# Patient Record
Sex: Male | Born: 1960 | Race: White | Hispanic: No | Marital: Married | Smoking: Never smoker
Health system: Southern US, Community
[De-identification: ages and names within clinical notes are randomized; demographics above are authoritative.]

## PROBLEM LIST (undated history)

## (undated) DIAGNOSIS — I209 Angina pectoris, unspecified: Secondary | ICD-10-CM

## (undated) DIAGNOSIS — I2510 Atherosclerotic heart disease of native coronary artery without angina pectoris: Secondary | ICD-10-CM

## (undated) DIAGNOSIS — J302 Other seasonal allergic rhinitis: Secondary | ICD-10-CM

## (undated) DIAGNOSIS — E559 Vitamin D deficiency, unspecified: Secondary | ICD-10-CM

## (undated) DIAGNOSIS — M1990 Unspecified osteoarthritis, unspecified site: Secondary | ICD-10-CM

## (undated) DIAGNOSIS — I10 Essential (primary) hypertension: Secondary | ICD-10-CM

## (undated) HISTORY — PX: SPINAL CORD STIMULATOR IMPLANT: SHX2422

## (undated) HISTORY — DX: Vitamin D deficiency, unspecified: E55.9

## (undated) HISTORY — PX: KNEE ARTHROSCOPY: SHX127

## (undated) HISTORY — PX: WISDOM TOOTH EXTRACTION: SHX21

---

## 2015-09-09 HISTORY — PX: CARDIAC CATHETERIZATION: SHX172

## 2015-09-10 ENCOUNTER — Encounter (HOSPITAL_COMMUNITY): Payer: Self-pay | Admitting: General Practice

## 2015-09-10 ENCOUNTER — Other Ambulatory Visit: Payer: Self-pay | Admitting: *Deleted

## 2015-09-10 ENCOUNTER — Inpatient Hospital Stay (HOSPITAL_COMMUNITY)
Admission: AD | Admit: 2015-09-10 | Discharge: 2015-09-18 | DRG: 236 | Disposition: A | Discharge status: Home / Self Care | Payer: BLUE CROSS/BLUE SHIELD | Source: Other Acute Inpatient Hospital | Attending: Cardiothoracic Surgery | Admitting: Cardiothoracic Surgery

## 2015-09-10 DIAGNOSIS — Z01810 Encounter for preprocedural cardiovascular examination: Secondary | ICD-10-CM

## 2015-09-10 DIAGNOSIS — I10 Essential (primary) hypertension: Secondary | ICD-10-CM | POA: Diagnosis present

## 2015-09-10 DIAGNOSIS — M549 Dorsalgia, unspecified: Secondary | ICD-10-CM | POA: Diagnosis present

## 2015-09-10 DIAGNOSIS — E785 Hyperlipidemia, unspecified: Secondary | ICD-10-CM | POA: Diagnosis present

## 2015-09-10 DIAGNOSIS — R079 Chest pain, unspecified: Secondary | ICD-10-CM | POA: Diagnosis present

## 2015-09-10 DIAGNOSIS — G8929 Other chronic pain: Secondary | ICD-10-CM | POA: Diagnosis present

## 2015-09-10 DIAGNOSIS — E8770 Fluid overload, unspecified: Secondary | ICD-10-CM | POA: Diagnosis present

## 2015-09-10 DIAGNOSIS — I2510 Atherosclerotic heart disease of native coronary artery without angina pectoris: Secondary | ICD-10-CM | POA: Diagnosis not present

## 2015-09-10 DIAGNOSIS — I25110 Atherosclerotic heart disease of native coronary artery with unstable angina pectoris: Principal | ICD-10-CM | POA: Diagnosis present

## 2015-09-10 DIAGNOSIS — Z951 Presence of aortocoronary bypass graft: Secondary | ICD-10-CM

## 2015-09-10 DIAGNOSIS — D62 Acute posthemorrhagic anemia: Secondary | ICD-10-CM | POA: Diagnosis not present

## 2015-09-10 DIAGNOSIS — Z6839 Body mass index (BMI) 39.0-39.9, adult: Secondary | ICD-10-CM | POA: Diagnosis not present

## 2015-09-10 DIAGNOSIS — R7303 Prediabetes: Secondary | ICD-10-CM | POA: Diagnosis present

## 2015-09-10 DIAGNOSIS — D696 Thrombocytopenia, unspecified: Secondary | ICD-10-CM | POA: Diagnosis not present

## 2015-09-10 DIAGNOSIS — M1990 Unspecified osteoarthritis, unspecified site: Secondary | ICD-10-CM | POA: Diagnosis present

## 2015-09-10 HISTORY — DX: Angina pectoris, unspecified: I20.9

## 2015-09-10 HISTORY — DX: Unspecified osteoarthritis, unspecified site: M19.90

## 2015-09-10 HISTORY — DX: Essential (primary) hypertension: I10

## 2015-09-10 HISTORY — DX: Atherosclerotic heart disease of native coronary artery without angina pectoris: I25.10

## 2015-09-10 HISTORY — DX: Other seasonal allergic rhinitis: J30.2

## 2015-09-10 LAB — BASIC METABOLIC PANEL
Anion gap: 11 (ref 5–15)
BUN: 13 mg/dL (ref 6–20)
CHLORIDE: 108 mmol/L (ref 101–111)
CO2: 24 mmol/L (ref 22–32)
CREATININE: 0.91 mg/dL (ref 0.61–1.24)
Calcium: 9.4 mg/dL (ref 8.9–10.3)
GFR calc Af Amer: >60 mL/min (ref >60)
GFR calc non Af Amer: >60 mL/min (ref >60)
Glucose, Bld: 106 mg/dL — ABNORMAL HIGH (ref 65–99)
Potassium: 3.7 mmol/L (ref 3.5–5.1)
Sodium: 143 mmol/L (ref 135–145)

## 2015-09-10 LAB — BLOOD GAS, ARTERIAL
Acid-Base Excess: 1 mmol/L (ref 0.0–2.0)
Bicarbonate: 25 mEq/L — ABNORMAL HIGH (ref 20.0–24.0)
DRAWN BY: 365291
FIO2: 0.21
O2 Saturation: 96 %
PATIENT TEMPERATURE: 98.6
PH ART: 7.413 (ref 7.350–7.450)
TCO2: 26.3 mmol/L (ref 0–100)
pCO2 arterial: 40 mmHg (ref 35.0–45.0)
pO2, Arterial: 80.3 mmHg (ref 80.0–100.0)

## 2015-09-10 LAB — TYPE AND SCREEN
ABO/RH(D): A POS
Antibody Screen: NEGATIVE

## 2015-09-10 LAB — CBC
HEMATOCRIT: 45 % (ref 39.0–52.0)
HEMOGLOBIN: 14.4 g/dL (ref 13.0–17.0)
MCH: 28.6 pg (ref 26.0–34.0)
MCHC: 32 g/dL (ref 30.0–36.0)
MCV: 89.3 fL (ref 78.0–100.0)
Platelets: 183 10*3/uL (ref 150–400)
RBC: 5.04 MIL/uL (ref 4.22–5.81)
RDW: 13.4 % (ref 11.5–15.5)
WBC: 6 10*3/uL (ref 4.0–10.5)

## 2015-09-10 LAB — HEPARIN LEVEL (UNFRACTIONATED)
Heparin Unfractionated: 0.13 IU/mL — ABNORMAL LOW (ref 0.30–0.70)
Heparin Unfractionated: 0.36 IU/mL (ref 0.30–0.70)

## 2015-09-10 LAB — PROTIME-INR
INR: 1.11 (ref 0.00–1.49)
PROTHROMBIN TIME: 14.5 s (ref 11.6–15.2)

## 2015-09-10 LAB — SURGICAL PCR SCREEN
MRSA, PCR: NEGATIVE
Staphylococcus aureus: NEGATIVE

## 2015-09-10 LAB — ABO/RH: ABO/RH(D): A POS

## 2015-09-10 LAB — APTT: aPTT: 45 seconds — ABNORMAL HIGH (ref 24–37)

## 2015-09-10 MED ORDER — ONDANSETRON HCL 4 MG/2ML IJ SOLN: 4.0000 mg | Freq: Four times a day (QID) | INTRAMUSCULAR | Status: DC | PRN

## 2015-09-10 MED ORDER — HEPARIN (PORCINE) IN NACL 100-0.45 UNIT/ML-% IJ SOLN
1800.0000 [IU]/h | INTRAMUSCULAR | Status: DC
  Administered 2015-09-11: 1600 [IU]/h via INTRAVENOUS
  Administered 2015-09-11 – 2015-09-13 (×3): 1800 [IU]/h via INTRAVENOUS
  Filled 2015-09-10 (×5): qty 250

## 2015-09-10 MED ORDER — ASPIRIN EC 81 MG PO TBEC
81.0000 mg | DELAYED_RELEASE_TABLET | Freq: Every day | ORAL | Status: DC
  Administered 2015-09-10 – 2015-09-13 (×4): 81 mg via ORAL
  Filled 2015-09-10 (×4): qty 1

## 2015-09-10 MED ORDER — ATORVASTATIN CALCIUM 40 MG PO TABS
40.0000 mg | ORAL_TABLET | Freq: Every day | ORAL | Status: DC
  Administered 2015-09-10 – 2015-09-13 (×4): 40 mg via ORAL
  Filled 2015-09-10 (×4): qty 1

## 2015-09-10 MED ORDER — MORPHINE SULFATE (PF) 2 MG/ML IV SOLN: 2.0000 mg | INTRAVENOUS | Status: DC | PRN

## 2015-09-10 MED ORDER — ACETAMINOPHEN 325 MG PO TABS: 650.0000 mg | ORAL_TABLET | Freq: Four times a day (QID) | ORAL | Status: DC | PRN

## 2015-09-10 MED ORDER — CARVEDILOL 6.25 MG PO TABS
6.2500 mg | ORAL_TABLET | Freq: Two times a day (BID) | ORAL | Status: DC
  Administered 2015-09-10 – 2015-09-11 (×2): 6.25 mg via ORAL
  Filled 2015-09-10 (×2): qty 1

## 2015-09-10 MED ORDER — LISINOPRIL 10 MG PO TABS
10.0000 mg | ORAL_TABLET | Freq: Every day | ORAL | Status: DC
  Administered 2015-09-11 – 2015-09-13 (×3): 10 mg via ORAL
  Filled 2015-09-10 (×3): qty 1

## 2015-09-10 MED ORDER — HEPARIN (PORCINE) IN NACL 100-0.45 UNIT/ML-% IJ SOLN: 1250.0000 [IU]/h | INTRAMUSCULAR | Status: DC

## 2015-09-10 MED ORDER — NITROGLYCERIN 0.4 MG SL SUBL: 0.4000 mg | SUBLINGUAL_TABLET | SUBLINGUAL | Status: DC | PRN

## 2015-09-10 NOTE — Progress Notes (Signed)
Patient arrived to the floor. IV hep infusing at 1,250 units/hour. Tele monitor placed. CCMD notified. No Chest pain. No SOB. Patient oriented to the unit. Call bell within reach. Will continue to monitor.   Valinda HoarLexie Natasa Stigall RN

## 2015-09-10 NOTE — H&P (Signed)
[address]       [place] [id number]             [phone]        Billy Waters Date of Birth: 04-May-1961  [facility] [place] Primary Care: Dr. Darcey NoraPositono  Chief Complaint:   CAD  History of Present Illness:      Billy Waters is a 55 yo white male with known history of HTN, Hyperlipidemia, and chronic back pain.  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  He noticed recently that after leaving softball practice while walking up the hill to his car he developed chest tightness.  He states this continued any time he was walking up an incline.  His wife works for a Games [employer]ctors office and got him an appointment to be seen.  EKG was obtained and showed changes that were concerning to his PCP.  He referred the patient to follow up with his Primary cardiology who after review of the EKG felt the patient should undergo stress testing.  This was originally scheduled for today, but the patient's chest tightness worsened.  He again presented for evaluation with his Cardiologist who performed his stress test sooner.  He lasted about 4 min prior to developing chest tightness.  He subsequently was scheduled for cardiac catheterization which showed multivessel CAD and a preserved EF.  It was felt coronary bypass grafting would be indicated and [facility]  He denies family history of heart disease.  He denies nicotine abuse.  Currently the patient is chest pain free.  Current Activity/ Functional Status: Patient is independent with mobility/ambulation, transfers, ADL's, IADL's.   Zubrod Score: At the time of surgery this patient's most appropriate activity status/level should be described as: []     0    Normal activity, no symptoms [x]     1    Restricted in physical strenuous activity but ambulatory, able to do out light work []     2     Ambulatory and capable of self care, unable to do work activities, up and about                 more than 50%  Of the time                            []     3    Only limited self care, in bed greater than 50% of waking hours []     4    Completely disabled, no self care, confined to bed or chair []     5    Moribund  No past medical history on file.  No past surgical history on file.  History  Smoking status  . Not on file  Smokeless tobacco  . Not on file    History  Alcohol Use: Not on file    Social History   Social History  . Marital Status: Married    Spouse Name: N/A  . Number of Children: N/A  . Years of Education: N/A   Occupational History  . Not on file.   Social History Main Topics  . Smoking status: Not on file  . Smokeless tobacco: Not on file  . Alcohol Use: Not on file  . Drug Use: Not on file  .  Sexual Activity: Not on file   Other Topics Concern  . Not on file   Social History Narrative  . No narrative on file    Allergies not on file  Current Facility-Administered Medications  Medication Dose Route Frequency Provider Last Rate Last Dose  . acetaminophen (TYLENOL) tablet 650 mg  650 mg Oral Q6H PRN Erin R Barrett, PA-C      . aspirin EC tablet 81 mg  81 mg Oral Daily Erin R Barrett, PA-C      . atorvastatin (LIPITOR) tablet 40 mg  40 mg Oral q1800 Erin R Barrett, PA-C      . carvedilol (COREG) tablet 6.25 mg  6.25 mg Oral BID WC Erin R Barrett, PA-C      . [START ON 09/11/2015] lisinopril (PRINIVIL,ZESTRIL) tablet 10 mg  10 mg Oral Daily Erin R Barrett, PA-C      . morphine 2 MG/ML injection 2 mg  2 mg Intravenous Q1H PRN Erin R Barrett, PA-C      . nitroGLYCERIN (NITROSTAT) SL tablet 0.4 mg  0.4 mg Sublingual Q5 min PRN Erin R Barrett, PA-C      . ondansetron (ZOFRAN) injection 4 mg  4 mg Intravenous Q6H PRN Erin R Barrett, PA-C        No prescriptions prior to admission    No family history on file.   Review of Systems:  Pertinent items  are noted in HPI.     Cardiac Review of Systems: Y or N  Chest Pain [  y  ]  Resting SOB [ n  ] Exertional SOB  [n  ]  Orthopnea [  ]   Pedal Edema [   ]    Palpitations [n  ] Syncope  [  ]   Presyncope [   ]  General Review of Systems: [Y] = yes [  ]=no Constitional: recent weight change [  ]; anorexia [  ]; fatigue [  ]; nausea [ n ]; night sweats [n  ]; fever [  ]; or chills [  ]                                                               Dental: poor dentition[  ]; Last Dentist visit:   Eye : blurred vision [  ]; diplopia [   ]; vision changes [  ];  Amaurosis fugax[  ]; Resp: cough [  ];  wheezing[  ];  hemoptysis[  ]; shortness of breath[  ]; paroxysmal nocturnal dyspnea[n  ]; dyspnea on exertion[n  ]; or orthopnea[  ];  GI:  gallstones[  ], vomiting[  ];  dysphagia[  ]; melena[  ];  hematochezia [  ]; heartburn[  ];   Hx of  Colonoscopy[  ]; GU: kidney stones [  ]; hematuria[  ];   dysuria [  ];  nocturia[  ];  history of     obstruction [  ]; urinary frequency [  ]             Skin: rash, swelling[  ];, hair loss[  ];  peripheral edema[  ];  or itching[  ]; Musculosketetal: myalgias[  ];  joint swelling[  ];  joint erythema[  ];  joint pain[  ];  back  pain[y  ];  Heme/Lymph: bruising[  ];  bleeding[  ];  anemia[  ];  Neuro: TIA[ n ];  headaches[  ];  stroke[  ];  vertigo[  ];  seizures[  ];   paresthesias[  ];  difficulty walking[  ];  Psych:depression[n  ]; anxiety[ n ];  Endocrine: diabetes[n  ];  thyroid dysfunction[n  ];  Immunizations: Flu [  ]; Pneumococcal[  ];  Other:  Physical Exam: BP 155/74 mmHg  Pulse 60  Temp(Src) 98 F (36.7 C) (Oral)  Resp 18  SpO2 94%   General appearance: alert, cooperative and no distress Head: Normocephalic, without obvious abnormality, atraumatic Neck: no adenopathy, no carotid bruit, no JVD, supple, symmetrical, trachea midline and thyroid not enlarged, symmetric, no tenderness/mass/nodules Lymph nodes: Cervical, supraclavicular, and  axillary nodes normal. Resp: clear to auscultation bilaterally Cardio: regular rate and rhythm GI: soft, non-tender; bowel sounds normal; no masses,  no organomegaly Extremities: extremities normal, atraumatic, no cyanosis or edema Neurologic: Grossly normal  Diagnostic Studies & Laboratory data:     Assessment / Plan:      1. CAD- on Heparin drip, tentative for CABG this admission 2. HTN- on Coreg, Lisinopril 3. Hyperlipidemia- on Lipitor 4. Preoperative testing- CBC, BMET, UA, CXR, ABG, Carotid Doppler, Type and Screen ordered 5. Dispo- patient stable, chest pain free on Heparin, plan for CABG Monday   I  spent 40 minutes counseling the patient face to face and 50% or more the  time was spent in counseling and coordination of care. The total time spent in the appointment was 20 minutes.    Denny Peon Barrett PA-C  09/10/2015 3:20 PM  Agree  With above findings and plan Cardiac cath personally reviewed and counseled with patient His best long term therapy is CABG which will be done first available time in OR schedule patient examined and medical record reviewed,agree with above note. Kathlee Nations Trigt III 09/10/2015

## 2015-09-10 NOTE — Progress Notes (Addendum)
ANTICOAGULATION CONSULT NOTE - Initial Consult  Pharmacy Consult for heparin  Indication: chest pain/ACS  Allergies not on file  Patient Measurements: Wt= 124.9kg Ht: 5' 10'' Heparin Dosing Weight: 101.3kg  Vital Signs: Temp: 98 F (36.7 C) (04/27 1415) Temp Source: Oral (04/27 1415) BP: 155/74 mmHg (04/27 1415) Pulse Rate: 60 (04/27 1415)  Labs: No results for input(s): HGB, HCT, PLT, APTT, LABPROT, INR, HEPARINUNFRC, HEPRLOWMOCWT, CREATININE, CKTOTAL, CKMB, TROPONINI in the last 72 hours.  CrCl cannot be calculated (Unknown ideal weight.).   Medical History: No past medical history on file.  Medications:  Scheduled:  . aspirin EC  81 mg Oral Daily  . atorvastatin  40 mg Oral q1800  . carvedilol  6.25 mg Oral BID WC  . [START ON 09/11/2015] lisinopril  10 mg Oral Daily    Assessment: 55 yo [place] with plans for [place] (scheduled for 4/30 per patient). [facility] Heparin is currently at 1250 units/hr -Labs [place]: hg= 14.8, plt= 184  Goal of Therapy:  Heparin level 0.3-0.7 units/ml Monitor platelets by anticoagulation protocol: Yes   Plan:  -No heparin changes now -Heparin level now and daily wth CBC daily  Harland Germanndrew Meyer, Pharm D 09/10/2015 2:58 PM   Addendum: Initial heparin level is low at 0.13 on 1250 units/hr. No issues with infusion.  Plan: 1) Increase heparin to 1600 units/hr 2) Check 6 hour heparin level  Louie CasaJennifer Caliya Narine, PharmD, BCPS 09/10/2015, 5:01 PM

## 2015-09-11 ENCOUNTER — Inpatient Hospital Stay (HOSPITAL_COMMUNITY): Payer: BLUE CROSS/BLUE SHIELD

## 2015-09-11 DIAGNOSIS — I2510 Atherosclerotic heart disease of native coronary artery without angina pectoris: Secondary | ICD-10-CM

## 2015-09-11 LAB — PULMONARY FUNCTION TEST
DL/VA % pred: 107 %
DL/VA: 4.96 ml/min/mmHg/L
DLCO cor % pred: 89 %
DLCO cor: 28.85 ml/min/mmHg
DLCO unc % pred: 89 %
DLCO unc: 29.01 ml/min/mmHg
FEF 25-75 Pre: 4.8 L/sec
FEF2575-%Pred-Pre: 148 %
FEV1-%Pred-Pre: 89 %
FEV1-Pre: 3.37 L
FEV1FVC-%Pred-Pre: 113 %
FEV6-%Pred-Pre: 81 %
FEV6-Pre: 3.86 L
FEV6FVC-%Pred-Pre: 104 %
FVC-%Pred-Pre: 78 %
FVC-Pre: 3.86 L
Pre FEV1/FVC ratio: 87 %
Pre FEV6/FVC Ratio: 100 %
RV % pred: 88 %
RV: 1.88 L
TLC % pred: 85 %
TLC: 5.97 L

## 2015-09-11 LAB — CBC
HCT: 44.9 % (ref 39.0–52.0)
Hemoglobin: 14.8 g/dL (ref 13.0–17.0)
MCH: 29.1 pg (ref 26.0–34.0)
MCHC: 33 g/dL (ref 30.0–36.0)
MCV: 88.2 fL (ref 78.0–100.0)
Platelets: 179 10*3/uL (ref 150–400)
RBC: 5.09 MIL/uL (ref 4.22–5.81)
RDW: 13.4 % (ref 11.5–15.5)
WBC: 7.3 10*3/uL (ref 4.0–10.5)

## 2015-09-11 LAB — URINALYSIS, ROUTINE W REFLEX MICROSCOPIC
Bilirubin Urine: NEGATIVE
Glucose, UA: NEGATIVE mg/dL
Hgb urine dipstick: NEGATIVE
Ketones, ur: NEGATIVE mg/dL
Leukocytes, UA: NEGATIVE
Nitrite: NEGATIVE
Protein, ur: NEGATIVE mg/dL
Specific Gravity, Urine: 1.016 (ref 1.005–1.030)
pH: 5.5 (ref 5.0–8.0)

## 2015-09-11 LAB — HEPARIN LEVEL (UNFRACTIONATED)
Heparin Unfractionated: 0.24 IU/mL — ABNORMAL LOW (ref 0.30–0.70)
Heparin Unfractionated: 0.46 IU/mL (ref 0.30–0.70)

## 2015-09-11 LAB — ECHOCARDIOGRAM COMPLETE
Height: 70 in
Weight: 4405.67 oz

## 2015-09-11 MED ORDER — HYDROCHLOROTHIAZIDE 12.5 MG PO CAPS
12.5000 mg | ORAL_CAPSULE | Freq: Every day | ORAL | Status: DC
  Administered 2015-09-11 – 2015-09-13 (×3): 12.5 mg via ORAL
  Filled 2015-09-11 (×3): qty 1

## 2015-09-11 MED ORDER — MAGNESIUM HYDROXIDE 400 MG/5ML PO SUSP: 30.0000 mL | Freq: Every day | ORAL | Status: DC | PRN

## 2015-09-11 MED ORDER — ZOLPIDEM TARTRATE 5 MG PO TABS: 5.0000 mg | ORAL_TABLET | Freq: Every evening | ORAL | Status: DC | PRN

## 2015-09-11 MED ORDER — ALBUTEROL SULFATE (2.5 MG/3ML) 0.083% IN NEBU
2.5000 mg | INHALATION_SOLUTION | Freq: Once | RESPIRATORY_TRACT | Status: AC
  Administered 2015-09-11: 2.5 mg via RESPIRATORY_TRACT

## 2015-09-11 MED ORDER — CARVEDILOL 3.125 MG PO TABS
3.1250 mg | ORAL_TABLET | Freq: Two times a day (BID) | ORAL | Status: DC
  Administered 2015-09-11 – 2015-09-13 (×5): 3.125 mg via ORAL
  Filled 2015-09-11 (×5): qty 1

## 2015-09-11 MED ORDER — ALUM & MAG HYDROXIDE-SIMETH 200-200-20 MG/5ML PO SUSP: 30.0000 mL | ORAL | Status: DC | PRN

## 2015-09-11 MED ORDER — GUAIFENESIN-DM 100-10 MG/5ML PO SYRP: 15.0000 mL | ORAL_SOLUTION | ORAL | Status: DC | PRN

## 2015-09-11 MED FILL — Heparin Sodium (Porcine) 100 Unt/ML in Sodium Chloride 0.45%: INTRAMUSCULAR | Qty: 250 | Status: AC

## 2015-09-11 NOTE — Progress Notes (Signed)
Chaplain presented to the patient's room to follow up a request to provide the patient with information and assist as needed with an Advance Directive. The patient was in medica testing at the time of this visit. The family was in the room and reports the patient has the Advance Directive and will call when he has completed it for Notary signature.chaplain will follow up with the patient Chaplain Janell Quietudrey Zenon Leaf [phone]

## 2015-09-11 NOTE — Progress Notes (Signed)
ANTICOAGULATION CONSULT NOTE - Follow Up Consult  Pharmacy Consult for Heparin  Indication: Multi-vessel disease awaiting CABG  Not on File  Patient Measurements: Height: 5\' 10"  (177.8 cm) Weight: 275 lb 5.7 oz (124.9 kg) IBW/kg (Calculated) : 73  Vital Signs: Temp: 98 F (36.7 C) (04/27 1415) Temp Source: Oral (04/27 1415) BP: 165/75 mmHg (04/27 1719) Pulse Rate: 75 (04/27 1719)  Labs:  Recent Labs  09/10/15 1529 09/10/15 2329  HGB 14.4  --   HCT 45.0  --   PLT 183  --   APTT 45*  --   LABPROT 14.5  --   INR 1.11  --   HEPARINUNFRC 0.13* 0.36  CREATININE 0.91  --     Estimated Creatinine Clearance: 123.1 mL/min (by C-G formula based on Cr of 0.91).   Assessment: [facility], heparin level is therapeutic x 1 after rate increase  Goal of Therapy:  Heparin level 0.3-0.7 units/ml Monitor platelets by anticoagulation protocol: Yes   Plan:  -Continue heparin at 1600 units/hr -Confirmatory HL with AM labs  Abran DukeLedford, Jakorian Marengo 09/11/2015,12:41 AM

## 2015-09-11 NOTE — Progress Notes (Signed)
ANTICOAGULATION CONSULT NOTE  Pharmacy Consult for heparin  Indication: chest pain/ACS  No Known Allergies  Patient Measurements: Wt= 124.9kg Ht: 5' 10'' Heparin Dosing Weight: 101.3kg  Vital Signs: Temp: 98.3 F (36.8 C) (04/28 1030) Temp Source: Oral (04/28 1030) BP: 127/58 mmHg (04/28 1030) Pulse Rate: 54 (04/28 0639)  Labs:  Recent Labs  09/10/15 1529 09/10/15 2329 09/11/15 0225 09/11/15 1421  HGB 14.4  --  14.8  --   HCT 45.0  --  44.9  --   PLT 183  --  179  --   APTT 45*  --   --   --   LABPROT 14.5  --   --   --   INR 1.11  --   --   --   HEPARINUNFRC 0.13* 0.36 0.24* 0.46  CREATININE 0.91  --   --   --     Estimated Creatinine Clearance: 123.1 mL/min (by C-G formula based on Cr of 0.91).   Medications:  Scheduled:  . aspirin EC  81 mg Oral Daily  . atorvastatin  40 mg Oral q1800  . carvedilol  3.125 mg Oral BID WC  . hydrochlorothiazide  12.5 mg Oral Daily  . lisinopril  10 mg Oral Daily    Assessment: 55 [place] with plans for [place] (scheduled for 4/30 per patient). [facility]. Heparin is currently at 1600 units/hr -Heparin level is at goal (HL= 0.46)  Goal of Therapy:  Heparin level 0.3-0.7 units/ml Monitor platelets by anticoagulation protocol: Yes   Plan:  -Increase heparin to 1800 units/hr -Heparin level daily wth CBC daily  Harland Germanndrew Loella Hickle, Pharm D 09/11/2015 3:51 PM

## 2015-09-11 NOTE — Progress Notes (Signed)
  Echocardiogram 2D Echocardiogram has been performed.  Billy SavoyCasey N Camilo Waters 09/11/2015, 12:37 PM

## 2015-09-11 NOTE — Progress Notes (Addendum)
      [address]       [place] [id number]             [phone]       Procedure(s) (LRB): CORONARY ARTERY BYPASS GRAFTING (CABG) (N/A) TRANSESOPHAGEAL ECHOCARDIOGRAM (TEE) (N/A)  Subjective:  Mr. Billy Waters has no complaints.  Denies chest pain.   Objective: Vital signs in last 24 hours: Temp:  [97.7 F (36.5 C)-98 F (36.7 C)] 97.7 F (36.5 C) (04/28 0516) Pulse Rate:  [54-75] 54 (04/28 0639) Cardiac Rhythm:  [-] Normal sinus rhythm (04/27 2045) Resp:  [18] 18 (04/28 0516) BP: (121-165)/(60-75) 123/60 mmHg (04/28 0639) SpO2:  [94 %-96 %] 96 % (04/28 0516) Weight:  [275 lb 5.7 oz (124.9 kg)] 275 lb 5.7 oz (124.9 kg) (04/27 1500)  General appearance: alert, cooperative and no distress Heart: regular rate and rhythm Lungs: clear to auscultation bilaterally Abdomen: soft, non-tender; bowel sounds normal; no masses,  no organomegaly Extremities: extremities normal, atraumatic, no cyanosis or edema Wound: clean and dry  Lab Results:  Recent Labs  09/10/15 1529 09/11/15 0225  WBC 6.0 7.3  HGB 14.4 14.8  HCT 45.0 44.9  PLT 183 179   BMET:  Recent Labs  09/10/15 1529  NA 143  K 3.7  CL 108  CO2 24  GLUCOSE 106*  BUN 13  CREATININE 0.91  CALCIUM 9.4    PT/INR:  Recent Labs  09/10/15 1529  LABPROT 14.5  INR 1.11   ABG    Component Value Date/Time   PHART 7.413 09/10/2015 1640   HCO3 25.0* 09/10/2015 1640   TCO2 26.3 09/10/2015 1640   O2SAT 96.0 09/10/2015 1640   CBG (last 3)  No results for input(s): GLUCAP in the last 72 hours.  Assessment/Plan: S/P Procedure(s) (LRB): CORONARY ARTERY BYPASS GRAFTING (CABG) (N/A) TRANSESOPHAGEAL ECHOCARDIOGRAM (TEE) (N/A)  1. CV- CAD, Sinus Huston FoleyBrady, Hypertensive at times- will decrease Coreg to 3.125, continue Heparin, Lisionpril, and statin 2. Pulm- no acute issues, CXR looks good 3. Dispo- patient stable, continue Heparin, preoperative test being completed, plan for OR Monday   LOS: 1  day    BARRETT, ERIN 09/11/2015  Echo, CXR and Dopplers all are normal  plan mutivessel cabg with L raqdial artery Mon am patient examined and medical record reviewed,agree with above note. Kathlee Nationseter Van Trigt III 09/11/2015

## 2015-09-11 NOTE — Progress Notes (Signed)
VASCULAR LAB PRELIMINARY  PRELIMINARY  PRELIMINARY  PRELIMINARY  Pre-op Cardiac Surgery  Carotid Findings:  Bilateral:  1-39% ICA stenosis.  Vertebral artery flow is antegrade.     Upper Extremity Right Left  Brachial Pressures 146 Triphasic 146 Triphasic  Radial Waveforms Triphasic Triphasic  Ulnar Waveforms Triphasic Triphasic  Palmar Arch (Allen's Test) Normal Normal   Findings:  Doppler waveforms remained normal with both radial and ulnar compressions    Lower  Extremity Right Left  Dorsalis Pedis 205 Triphasic 205 Triphasic  Posterior Tibial 184 Triphasic 184 Triphasic  Ankle/Brachial Indices 1.4 1.4     Findings:  ABIs and Doppler waveforms are within normal limits bilaterally at rest.   Dulse Rutan, RVS 09/11/2015, 12:28 PM

## 2015-09-11 NOTE — Progress Notes (Signed)
ANTICOAGULATION CONSULT NOTE  Pharmacy Consult for heparin  Indication: chest pain/ACS  Not on File  Patient Measurements: Wt= 124.9kg Ht: 5' 10'' Heparin Dosing Weight: 101.3kg  Vital Signs: Temp: 97.7 F (36.5 C) (04/28 0516) Temp Source: Oral (04/28 0516) BP: 123/60 mmHg (04/28 0639) Pulse Rate: 54 (04/28 0639)  Labs:  Recent Labs  09/10/15 1529 09/10/15 2329 09/11/15 0225  HGB 14.4  --  14.8  HCT 45.0  --  44.9  PLT 183  --  179  APTT 45*  --   --   LABPROT 14.5  --   --   INR 1.11  --   --   HEPARINUNFRC 0.13* 0.36 0.24*  CREATININE 0.91  --   --     Estimated Creatinine Clearance: 123.1 mL/min (by C-G formula based on Cr of 0.91).   Medications:  Scheduled:  . aspirin EC  81 mg Oral Daily  . atorvastatin  40 mg Oral q1800  . carvedilol  3.125 mg Oral BID WC  . lisinopril  10 mg Oral Daily    Assessment: 55 [place] plans for [place] (scheduled for 4/30 per patient). [facility] Heparin is currently at 1600 units/hr -Heparin level is below goal (HL= 0.24)  Goal of Therapy:  Heparin level 0.3-0.7 units/ml Monitor platelets by anticoagulation protocol: Yes   Plan:  -Increase heparin to 1800 units/hr -Heparin level in 6hrs and daily wth CBC daily  Harland Germanndrew Shamonique Battiste, Pharm D 09/11/2015 8:05 AM   Addendum: Initial heparin level is low at 0.13 on 1250 units/hr. No issues with infusion.  Plan: 1) Increase heparin to 1600 units/hr 2) Check 6 hour heparin level  Louie CasaJennifer Markle, PharmD, BCPS 09/10/2015, 5:01 PM

## 2015-09-11 NOTE — Care Management Note (Signed)
Case Management Note Donn PieriniKristi Jacobus Colvin RN, BSN Unit 2W-Case Manager [phone]  Patient Details  Name: Billy Waters MRN: [id number] Date of Birth: February 02, 1961  Subjective/Objective:    Pt admitted with 3VD post cath - plan for CABG on 09/14/15                Action/Plan: PTA pt lived at home- anticipate return home- CM to follow post op for pt progress and d/c needs  Expected Discharge Date:                  Expected Discharge Plan:  Home/Self Care  In-House Referral:     Discharge planning Services  CM Consult  Post Acute Care Choice:    Choice offered to:     DME Arranged:    DME Agency:     HH Arranged:    HH Agency:     Status of Service:  In process, will continue to follow  Medicare Important Message Given:    Date Medicare IM Given:    Medicare IM give by:    Date Additional Medicare IM Given:    Additional Medicare Important Message give by:     If discussed at Long Length of Stay Meetings, dates discussed:    Additional Comments:  Darrold SpanWebster, Verdene Creson Hall, RN 09/11/2015, 2:45 PM

## 2015-09-11 NOTE — Progress Notes (Signed)
CARDIAC REHAB PHASE I   Pt is ambulating independently with family, no complaints, declines additional ambulation at this time. Pre-op education completed with pt and sisters at bedside. Reviewed IS, activity progression, sternal precautions, cardiac surgery booklet and cardiac surgery guidelines. Left instructions to view cardiac surgery videos. Pt verbalized understanding. Pt in bed, call bell within reach. Will follow.   [id number]  Joylene GrapesEmily C Stedman [place], RN, BSN 09/11/2015 2:51 PM

## 2015-09-11 NOTE — Progress Notes (Signed)
Utilization review completed.  

## 2015-09-12 LAB — CBC
HCT: 46.4 % (ref 39.0–52.0)
Hemoglobin: 15.3 g/dL (ref 13.0–17.0)
MCH: 29.8 pg (ref 26.0–34.0)
MCHC: 33 g/dL (ref 30.0–36.0)
MCV: 90.4 fL (ref 78.0–100.0)
Platelets: 187 10*3/uL (ref 150–400)
RBC: 5.13 MIL/uL (ref 4.22–5.81)
RDW: 13.4 % (ref 11.5–15.5)
WBC: 8.7 10*3/uL (ref 4.0–10.5)

## 2015-09-12 LAB — HEPARIN LEVEL (UNFRACTIONATED): Heparin Unfractionated: 0.56 IU/mL (ref 0.30–0.70)

## 2015-09-12 NOTE — Progress Notes (Signed)
ANTICOAGULATION CONSULT NOTE  Pharmacy Consult for heparin  Indication: chest pain/ACS  No Known Allergies  Patient Measurements: Wt= 124.9kg Ht: 5' 10'' Heparin Dosing Weight: 101.3kg  Vital Signs: Temp: 98 F (36.7 C) (04/29 0605) Temp Source: Oral (04/29 0605) BP: 128/60 mmHg (04/29 0605) Pulse Rate: 54 (04/29 0605)  Labs:  Recent Labs  09/10/15 1529  09/11/15 0225 09/11/15 1421 09/12/15 0256  HGB 14.4  --  14.8  --  15.3  HCT 45.0  --  44.9  --  46.4  PLT 183  --  179  --  187  APTT 45*  --   --   --   --   LABPROT 14.5  --   --   --   --   INR 1.11  --   --   --   --   HEPARINUNFRC 0.13*  < > 0.24* 0.46 0.56  CREATININE 0.91  --   --   --   --   < > = values in this interval not displayed.  Estimated Creatinine Clearance: 123.1 mL/min (by C-G formula based on Cr of 0.91).   Medications:  Scheduled:  . aspirin EC  81 mg Oral Daily  . atorvastatin  40 mg Oral q1800  . carvedilol  3.125 mg Oral BID WC  . hydrochlorothiazide  12.5 mg Oral Daily  . lisinopril  10 mg Oral Daily    Assessment: 55 [place] with plans for CABG (scheduled for 5/1). [facility]. Heparin is currently at 1800 units/hr -Heparin level is at goal (HL= 0.56)  Goal of Therapy:  Heparin level 0.3-0.7 units/ml Monitor platelets by anticoagulation protocol: Yes   Plan:  -No heparin changes needed -Heparin level daily wth CBC daily  Harland Germanndrew Paxten Appelt, Pharm D 09/12/2015 10:29 AM

## 2015-09-12 NOTE — Progress Notes (Signed)
Patient lying in bed, family present at bedside. Call light within reach. 

## 2015-09-12 NOTE — Progress Notes (Addendum)
      [address]       [place] [id number]             [phone]        Procedure(s) (LRB): CORONARY ARTERY BYPASS GRAFTING (CABG) (N/A) TRANSESOPHAGEAL ECHOCARDIOGRAM (TEE) (N/A) LEFT RADIAL ARTERY HARVEST (Left) Subjective: Feels fine  Objective: Vital signs in last 24 hours: Temp:  [98 F (36.7 C)-98.3 F (36.8 C)] 98 F (36.7 C) (04/29 0605) Pulse Rate:  [54-62] 54 (04/29 0605) Cardiac Rhythm:  [-] Normal sinus rhythm (04/28 1900) Resp:  [18] 18 (04/29 0605) BP: (127-131)/(55-67) 128/60 mmHg (04/29 0605) SpO2:  [92 %-100 %] 100 % (04/29 0605)  Hemodynamic parameters for last 24 hours:    Intake/Output from previous day: 04/28 0701 - 04/29 0700 In: 240 [P.O.:240] Out: -  Intake/Output this shift:    General appearance: alert, cooperative and no distress Heart: regular rate and rhythm Lungs: clear to auscultation bilaterally Abdomen: benign Extremities: no edema Wound: incis healing well  Lab Results:  Recent Labs  09/11/15 0225 09/12/15 0256  WBC 7.3 8.7  HGB 14.8 15.3  HCT 44.9 46.4  PLT 179 187   BMET:  Recent Labs  09/10/15 1529  NA 143  K 3.7  CL 108  CO2 24  GLUCOSE 106*  BUN 13  CREATININE 0.91  CALCIUM 9.4    PT/INR:  Recent Labs  09/10/15 1529  LABPROT 14.5  INR 1.11   ABG    Component Value Date/Time   PHART 7.413 09/10/2015 1640   HCO3 25.0* 09/10/2015 1640   TCO2 26.3 09/10/2015 1640   O2SAT 96.0 09/10/2015 1640   CBG (last 3)  No results for input(s): GLUCAP in the last 72 hours.  Meds Scheduled Meds: . aspirin EC  81 mg Oral Daily  . atorvastatin  40 mg Oral q1800  . carvedilol  3.125 mg Oral BID WC  . hydrochlorothiazide  12.5 mg Oral Daily  . lisinopril  10 mg Oral Daily   Continuous Infusions: . heparin 1,800 Units/hr (09/11/15 1746)   PRN Meds:.acetaminophen, alum & mag hydroxide-simeth, guaiFENesin-dextromethorphan, magnesium hydroxide, morphine injection, nitroGLYCERIN,  ondansetron (ZOFRAN) IV, zolpidem  Xrays Dg Chest 2 View  09/11/2015  CLINICAL DATA:  55 year old male preoperative study for CABG. Initial encounter. EXAM: CHEST  2 VIEW COMPARISON:  None. FINDINGS: Lung volumes are within normal limits. Normal cardiac size and mediastinal contours. Visualized tracheal air column is within normal limits. No pneumothorax, pulmonary edema, pleural effusion or confluent pulmonary opacity. Flowing degenerative osteophytes in the mid and lower thoracic spine. No acute osseous abnormality identified. IMPRESSION: No acute cardiopulmonary abnormality. Electronically Signed   By: Odessa FlemingH  Hall M.D.   On: 09/11/2015 07:53    Assessment/Plan: S/P Procedure(s) (LRB): CORONARY ARTERY BYPASS GRAFTING (CABG) (N/A) TRANSESOPHAGEAL ECHOCARDIOGRAM (TEE) (N/A) LEFT RADIAL ARTERY HARVEST (Left)  1 stable without CP/SOB 2 he is brady in 50's but tolerating well, BP is stable- monitor ,if gets too slow will have to d/c coreg  LOS: 2 days    GOLD,WAYNE E 09/12/2015  Patient seen and examined, agree with above For CABG Monday  Quindon Denker C. Dorris FetchHendrickson, MD [facility] [phone]

## 2015-09-13 LAB — CBC
HCT: 46.1 % (ref 39.0–52.0)
Hemoglobin: 15.3 g/dL (ref 13.0–17.0)
MCH: 29.8 pg (ref 26.0–34.0)
MCHC: 33.2 g/dL (ref 30.0–36.0)
MCV: 89.7 fL (ref 78.0–100.0)
Platelets: 195 10*3/uL (ref 150–400)
RBC: 5.14 MIL/uL (ref 4.22–5.81)
RDW: 13.5 % (ref 11.5–15.5)
WBC: 9.1 10*3/uL (ref 4.0–10.5)

## 2015-09-13 LAB — HEPARIN LEVEL (UNFRACTIONATED): Heparin Unfractionated: 0.43 IU/mL (ref 0.30–0.70)

## 2015-09-13 MED ORDER — METOPROLOL TARTRATE 12.5 MG HALF TABLET
12.5000 mg | ORAL_TABLET | Freq: Once | ORAL | Status: AC
  Administered 2015-09-14: 12.5 mg via ORAL
  Filled 2015-09-13: qty 1

## 2015-09-13 MED ORDER — SODIUM CHLORIDE 0.9 % IV SOLN
1500.0000 mg | INTRAVENOUS | Status: AC
  Administered 2015-09-14: 1500 mg via INTRAVENOUS
  Filled 2015-09-13: qty 1500

## 2015-09-13 MED ORDER — PLASMA-LYTE 148 IV SOLN
INTRAVENOUS | Status: AC
  Administered 2015-09-14: 500 mL
  Filled 2015-09-13: qty 2.5

## 2015-09-13 MED ORDER — CHLORHEXIDINE GLUCONATE 0.12 % MT SOLN
15.0000 mL | Freq: Once | OROMUCOSAL | Status: AC
  Administered 2015-09-14: 15 mL via OROMUCOSAL
  Filled 2015-09-13: qty 15

## 2015-09-13 MED ORDER — EPINEPHRINE HCL 1 MG/ML IJ SOLN
0.0000 ug/min | INTRAVENOUS | Status: DC
  Filled 2015-09-13: qty 4

## 2015-09-13 MED ORDER — SODIUM CHLORIDE 0.9 % IV SOLN
INTRAVENOUS | Status: AC
  Administered 2015-09-14: 69.8 mL/h via INTRAVENOUS
  Filled 2015-09-13: qty 40

## 2015-09-13 MED ORDER — PHENYLEPHRINE HCL 10 MG/ML IJ SOLN
30.0000 ug/min | INTRAVENOUS | Status: AC
  Administered 2015-09-14: 15 ug/min via INTRAVENOUS
  Filled 2015-09-13: qty 2

## 2015-09-13 MED ORDER — ALPRAZOLAM 0.25 MG PO TABS: 0.2500 mg | ORAL_TABLET | ORAL | Status: DC | PRN

## 2015-09-13 MED ORDER — BISACODYL 5 MG PO TBEC
5.0000 mg | DELAYED_RELEASE_TABLET | Freq: Once | ORAL | Status: AC
  Administered 2015-09-13: 5 mg via ORAL
  Filled 2015-09-13: qty 1

## 2015-09-13 MED ORDER — NITROGLYCERIN IN D5W 200-5 MCG/ML-% IV SOLN
2.0000 ug/min | INTRAVENOUS | Status: AC
  Administered 2015-09-14: 5 ug/min via INTRAVENOUS
  Filled 2015-09-13: qty 250

## 2015-09-13 MED ORDER — DEXMEDETOMIDINE HCL IN NACL 400 MCG/100ML IV SOLN
0.1000 ug/kg/h | INTRAVENOUS | Status: AC
  Administered 2015-09-14: .2 ug/kg/h via INTRAVENOUS
  Filled 2015-09-13 (×2): qty 100

## 2015-09-13 MED ORDER — TEMAZEPAM 15 MG PO CAPS
15.0000 mg | ORAL_CAPSULE | Freq: Once | ORAL | Status: AC | PRN
  Administered 2015-09-13: 15 mg via ORAL
  Filled 2015-09-13: qty 1

## 2015-09-13 MED ORDER — CHLORHEXIDINE GLUCONATE 4 % EX LIQD
60.0000 mL | Freq: Once | CUTANEOUS | Status: AC
  Administered 2015-09-14: 4 via TOPICAL
  Filled 2015-09-13: qty 60

## 2015-09-13 MED ORDER — DEXTROSE 5 % IV SOLN
750.0000 mg | INTRAVENOUS | Status: DC
  Filled 2015-09-13: qty 750

## 2015-09-13 MED ORDER — DEXTROSE 5 % IV SOLN
1.5000 g | INTRAVENOUS | Status: AC
  Administered 2015-09-14: .75 g via INTRAVENOUS
  Administered 2015-09-14: 1.5 g via INTRAVENOUS
  Filled 2015-09-13: qty 1.5

## 2015-09-13 MED ORDER — MAGNESIUM SULFATE 50 % IJ SOLN
40.0000 meq | INTRAMUSCULAR | Status: DC
  Filled 2015-09-13: qty 10

## 2015-09-13 MED ORDER — POTASSIUM CHLORIDE 2 MEQ/ML IV SOLN
80.0000 meq | INTRAVENOUS | Status: DC
  Filled 2015-09-13: qty 40

## 2015-09-13 MED ORDER — SODIUM CHLORIDE 0.9 % IV SOLN
INTRAVENOUS | Status: AC
  Administered 2015-09-14: 1 [IU]/h via INTRAVENOUS
  Filled 2015-09-13: qty 2.5

## 2015-09-13 MED ORDER — SODIUM CHLORIDE 0.9 % IV SOLN
INTRAVENOUS | Status: DC
  Filled 2015-09-13: qty 30

## 2015-09-13 MED ORDER — DIAZEPAM 5 MG PO TABS
5.0000 mg | ORAL_TABLET | Freq: Once | ORAL | Status: AC
  Administered 2015-09-14: 5 mg via ORAL
  Filled 2015-09-13: qty 1

## 2015-09-13 MED ORDER — POTASSIUM CHLORIDE CRYS ER 20 MEQ PO TBCR
40.0000 meq | EXTENDED_RELEASE_TABLET | Freq: Once | ORAL | Status: AC
  Administered 2015-09-13: 40 meq via ORAL
  Filled 2015-09-13: qty 2

## 2015-09-13 MED ORDER — CHLORHEXIDINE GLUCONATE 4 % EX LIQD
60.0000 mL | Freq: Once | CUTANEOUS | Status: AC
  Administered 2015-09-13: 4 via TOPICAL
  Filled 2015-09-13: qty 60

## 2015-09-13 MED ORDER — DOPAMINE-DEXTROSE 3.2-5 MG/ML-% IV SOLN
0.0000 ug/kg/min | INTRAVENOUS | Status: AC
  Administered 2015-09-14: 2.5 ug/kg/min via INTRAVENOUS
  Filled 2015-09-13: qty 250

## 2015-09-13 NOTE — Progress Notes (Signed)
      [address]       [place] [id number]             [phone]        Procedure(s) (LRB): CORONARY ARTERY BYPASS GRAFTING (CABG) (N/A) TRANSESOPHAGEAL ECHOCARDIOGRAM (TEE) (N/A) LEFT RADIAL ARTERY HARVEST (Left) Subjective: Feels fine  Objective: Vital signs in last 24 hours: Temp:  [97.7 F (36.5 C)-98.3 F (36.8 C)] 97.7 F (36.5 C) (04/30 0329) Pulse Rate:  [60] 60 (04/30 0329) Cardiac Rhythm:  [-] Normal sinus rhythm (04/29 1900) Resp:  [17-18] 18 (04/30 0329) BP: (115-131)/(57-64) 115/57 mmHg (04/30 0329) SpO2:  [94 %-98 %] 94 % (04/30 0329)  Hemodynamic parameters for last 24 hours:    Intake/Output from previous day: 04/29 0701 - 04/30 0700 In: 960 [P.O.:960] Out: -  Intake/Output this shift:    General appearance: alert, cooperative and no distress Heart: regular rate and rhythm and no jvd Lungs: clear to auscultation bilaterally Abdomen: benign Extremities: no edema, warm  Lab Results:  Recent Labs  09/12/15 0256 09/13/15 0257  WBC 8.7 9.1  HGB 15.3 15.3  HCT 46.4 46.1  PLT 187 195   BMET:  Recent Labs  09/10/15 1529  NA 143  K 3.7  CL 108  CO2 24  GLUCOSE 106*  BUN 13  CREATININE 0.91  CALCIUM 9.4    PT/INR:  Recent Labs  09/10/15 1529  LABPROT 14.5  INR 1.11   ABG    Component Value Date/Time   PHART 7.413 09/10/2015 1640   HCO3 25.0* 09/10/2015 1640   TCO2 26.3 09/10/2015 1640   O2SAT 96.0 09/10/2015 1640   CBG (last 3)  No results for input(s): GLUCAP in the last 72 hours.  Meds Scheduled Meds: . aspirin EC  81 mg Oral Daily  . atorvastatin  40 mg Oral q1800  . carvedilol  3.125 mg Oral BID WC  . hydrochlorothiazide  12.5 mg Oral Daily  . lisinopril  10 mg Oral Daily   Continuous Infusions: . heparin 1,800 Units/hr (09/12/15 0936)   PRN Meds:.acetaminophen, alum & mag hydroxide-simeth, guaiFENesin-dextromethorphan, magnesium hydroxide, morphine injection, nitroGLYCERIN, ondansetron  (ZOFRAN) IV, zolpidem  Xrays No results found.  Assessment/Plan: 1 remains stable in sinus brady. Did have short burst of WCT- will give dose of K+ to get > 4.0 2 for OR in am 3 conts heparin  LOS: 3 days    Arraya Buck E 09/13/2015

## 2015-09-13 NOTE — Anesthesia Preprocedure Evaluation (Addendum)
Anesthesia Evaluation  Patient identified by MRN, date of birth, ID band Patient awake    Reviewed: Allergy & Precautions, H&P , NPO status , Patient's Chart, lab work & pertinent test results  History of Anesthesia Complications Negative for: history of anesthetic complications  Airway Mallampati: II  TM Distance: >3 FB Neck ROM: full    Dental no notable dental hx.    Pulmonary neg pulmonary ROS,    Pulmonary exam normal breath sounds clear to auscultation       Cardiovascular hypertension, Pt. on medications + angina at rest + CAD  (-) CHF negative cardio ROS Normal cardiovascular exam Rhythm:regular Rate:Normal     Neuro/Psych negative neurological ROS  negative psych ROS   GI/Hepatic negative GI ROS, Neg liver ROS,   Endo/Other  Morbid obesity  Renal/GU negative Renal ROS     Musculoskeletal  (+) Arthritis ,   Abdominal   Peds  Hematology negative hematology ROS (+)   Anesthesia Other Findings   Reproductive/Obstetrics negative OB ROS                            Anesthesia Physical Anesthesia Plan  ASA: IV  Anesthesia Plan: General   Post-op Pain Management:    Induction: Intravenous  Airway Management Planned: Oral ETT  Additional Equipment: CVP, Arterial line, TEE, PA Cath and Ultrasound Guidance Line Placement  Intra-op Plan:   Post-operative Plan: Post-operative intubation/ventilation  Informed Consent: I have reviewed the patients History and Physical, chart, labs and discussed the procedure including the risks, benefits and alternatives for the proposed anesthesia with the patient or authorized representative who has indicated his/her understanding and acceptance.   Dental Advisory Given  Plan Discussed with: Anesthesiologist, CRNA and Surgeon  Anesthesia Plan Comments: (Right radial A line)        Anesthesia Quick Evaluation

## 2015-09-13 NOTE — Progress Notes (Signed)
ANTICOAGULATION CONSULT NOTE  Pharmacy Consult for heparin  Indication: chest pain/ACS  No Known Allergies  Patient Measurements: Wt= 124.9kg Ht: 5' 10'' Heparin Dosing Weight: 101.3kg  Vital Signs: Temp: 97.7 F (36.5 C) (04/30 0329) Temp Source: Oral (04/30 0329) BP: 115/57 mmHg (04/30 0329) Pulse Rate: 60 (04/30 0329)  Labs:  Recent Labs  09/10/15 1529  09/11/15 0225 09/11/15 1421 09/12/15 0256 09/13/15 0252 09/13/15 0257  HGB 14.4  --  14.8  --  15.3  --  15.3  HCT 45.0  --  44.9  --  46.4  --  46.1  PLT 183  --  179  --  187  --  195  APTT 45*  --   --   --   --   --   --   LABPROT 14.5  --   --   --   --   --   --   INR 1.11  --   --   --   --   --   --   HEPARINUNFRC 0.13*  < > 0.24* 0.46 0.56 0.43  --   CREATININE 0.91  --   --   --   --   --   --   < > = values in this interval not displayed.  Estimated Creatinine Clearance: 123.1 mL/min (by C-G formula based on Cr of 0.91).   Medications:  Scheduled:  . aspirin EC  81 mg Oral Daily  . atorvastatin  40 mg Oral q1800  . carvedilol  3.125 mg Oral BID WC  . hydrochlorothiazide  12.5 mg Oral Daily  . lisinopril  10 mg Oral Daily    Assessment: 55 yo [place] with plans for CABG (scheduled for 5/1). [facility]. Heparin is currently at 1800 units/hr -Heparin level is at goal (HL= 0.43)  Goal of Therapy:  Heparin level 0.3-0.7 units/ml Monitor platelets by anticoagulation protocol: Yes   Plan:  -No heparin changes needed -Heparin level daily wth CBC daily  Harland GermanAndrew Damari Suastegui, Pharm D 09/13/2015 10:57 AM

## 2015-09-13 NOTE — Progress Notes (Signed)
Patient lying in bed, wife present at bedside. No needs at this time call light within reach

## 2015-09-13 NOTE — Progress Notes (Signed)
Pt and significant other have received all pre surgery educational materials.  Pt and significant other decline to watch video, state they have read all material and discussed upcoming procedure with Dr. Donata ClayVan Trigt and staff three times and have a good understanding of the upcoming procedure.  Incentive spirometer information sheet given along with risks/benefits of blood transfusion.  Continue to provide education and support to Pt and family.  Pt continues to deny chest pain.

## 2015-09-14 ENCOUNTER — Encounter (HOSPITAL_COMMUNITY)
Admission: AD | Disposition: A | Discharge status: Home / Self Care | Payer: Self-pay | Source: Other Acute Inpatient Hospital | Attending: Cardiothoracic Surgery

## 2015-09-14 ENCOUNTER — Inpatient Hospital Stay (HOSPITAL_COMMUNITY): Payer: BLUE CROSS/BLUE SHIELD | Admitting: Anesthesiology

## 2015-09-14 ENCOUNTER — Inpatient Hospital Stay (HOSPITAL_COMMUNITY): Payer: BLUE CROSS/BLUE SHIELD

## 2015-09-14 DIAGNOSIS — I25110 Atherosclerotic heart disease of native coronary artery with unstable angina pectoris: Secondary | ICD-10-CM

## 2015-09-14 DIAGNOSIS — Z951 Presence of aortocoronary bypass graft: Secondary | ICD-10-CM

## 2015-09-14 HISTORY — PX: TEE WITHOUT CARDIOVERSION: SHX5443

## 2015-09-14 HISTORY — PX: CORONARY ARTERY BYPASS GRAFT: SHX141

## 2015-09-14 HISTORY — PX: RADIAL ARTERY HARVEST: SHX5067

## 2015-09-14 LAB — POCT I-STAT 3, ART BLOOD GAS (G3+)
Acid-base deficit: 1 mmol/L (ref 0.0–2.0)
Acid-base deficit: 1 mmol/L (ref 0.0–2.0)
Acid-base deficit: 4 mmol/L — ABNORMAL HIGH (ref 0.0–2.0)
Acid-base deficit: 6 mmol/L — ABNORMAL HIGH (ref 0.0–2.0)
Acid-base deficit: 2 mmol/L (ref 0.0–2.0)
Bicarbonate: 25.9 mEq/L — ABNORMAL HIGH (ref 20.0–24.0)
Bicarbonate: 24.5 mEq/L — ABNORMAL HIGH (ref 20.0–24.0)
Bicarbonate: 22.6 mEq/L (ref 20.0–24.0)
Bicarbonate: 20.5 mEq/L (ref 20.0–24.0)
Bicarbonate: 23.9 mEq/L (ref 20.0–24.0)
O2 Saturation: 100 %
O2 Saturation: 99 %
O2 Saturation: 94 %
O2 Saturation: 94 %
O2 Saturation: 96 %
Patient temperature: 36.8
Patient temperature: 37.6
Patient temperature: 37.6
TCO2: 27 mmol/L (ref 0–100)
TCO2: 26 mmol/L (ref 0–100)
TCO2: 24 mmol/L (ref 0–100)
TCO2: 22 mmol/L (ref 0–100)
TCO2: 25 mmol/L (ref 0–100)
pCO2 arterial: 52.9 mmHg — ABNORMAL HIGH (ref 35.0–45.0)
pCO2 arterial: 43.3 mmHg (ref 35.0–45.0)
pCO2 arterial: 45.7 mmHg — ABNORMAL HIGH (ref 35.0–45.0)
pCO2 arterial: 42.5 mmHg (ref 35.0–45.0)
pCO2 arterial: 45.4 mmHg — ABNORMAL HIGH (ref 35.0–45.0)
pH, Arterial: 7.298 — ABNORMAL LOW (ref 7.350–7.450)
pH, Arterial: 7.361 (ref 7.350–7.450)
pH, Arterial: 7.301 — ABNORMAL LOW (ref 7.350–7.450)
pH, Arterial: 7.296 — ABNORMAL LOW (ref 7.350–7.450)
pH, Arterial: 7.332 — ABNORMAL LOW (ref 7.350–7.450)
pO2, Arterial: 267 mmHg — ABNORMAL HIGH (ref 80.0–100.0)
pO2, Arterial: 133 mmHg — ABNORMAL HIGH (ref 80.0–100.0)
pO2, Arterial: 78 mmHg — ABNORMAL LOW (ref 80.0–100.0)
pO2, Arterial: 81 mmHg (ref 80.0–100.0)
pO2, Arterial: 87 mmHg (ref 80.0–100.0)

## 2015-09-14 LAB — POCT I-STAT, CHEM 8
BUN: 15 mg/dL (ref 6–20)
BUN: 15 mg/dL (ref 6–20)
BUN: 16 mg/dL (ref 6–20)
BUN: 13 mg/dL (ref 6–20)
BUN: 14 mg/dL (ref 6–20)
BUN: 13 mg/dL (ref 6–20)
BUN: 13 mg/dL (ref 6–20)
Calcium, Ion: 1.26 mmol/L — ABNORMAL HIGH (ref 1.12–1.23)
Calcium, Ion: 1.26 mmol/L — ABNORMAL HIGH (ref 1.12–1.23)
Calcium, Ion: 1.27 mmol/L — ABNORMAL HIGH (ref 1.12–1.23)
Calcium, Ion: 1.03 mmol/L — ABNORMAL LOW (ref 1.12–1.23)
Calcium, Ion: 1.05 mmol/L — ABNORMAL LOW (ref 1.12–1.23)
Calcium, Ion: 1.01 mmol/L — ABNORMAL LOW (ref 1.12–1.23)
Calcium, Ion: 1.51 mmol/L — ABNORMAL HIGH (ref 1.12–1.23)
Chloride: 102 mmol/L (ref 101–111)
Chloride: 102 mmol/L (ref 101–111)
Chloride: 102 mmol/L (ref 101–111)
Chloride: 95 mmol/L — ABNORMAL LOW (ref 101–111)
Chloride: 99 mmol/L — ABNORMAL LOW (ref 101–111)
Chloride: 100 mmol/L — ABNORMAL LOW (ref 101–111)
Chloride: 102 mmol/L (ref 101–111)
Creatinine, Ser: 0.7 mg/dL (ref 0.61–1.24)
Creatinine, Ser: 0.8 mg/dL (ref 0.61–1.24)
Creatinine, Ser: 0.8 mg/dL (ref 0.61–1.24)
Creatinine, Ser: 0.6 mg/dL — ABNORMAL LOW (ref 0.61–1.24)
Creatinine, Ser: 0.8 mg/dL (ref 0.61–1.24)
Creatinine, Ser: 0.7 mg/dL (ref 0.61–1.24)
Creatinine, Ser: 0.8 mg/dL (ref 0.61–1.24)
Glucose, Bld: 112 mg/dL — ABNORMAL HIGH (ref 65–99)
Glucose, Bld: 103 mg/dL — ABNORMAL HIGH (ref 65–99)
Glucose, Bld: 103 mg/dL — ABNORMAL HIGH (ref 65–99)
Glucose, Bld: 95 mg/dL (ref 65–99)
Glucose, Bld: 122 mg/dL — ABNORMAL HIGH (ref 65–99)
Glucose, Bld: 147 mg/dL — ABNORMAL HIGH (ref 65–99)
Glucose, Bld: 137 mg/dL — ABNORMAL HIGH (ref 65–99)
HCT: 47 % (ref 39.0–52.0)
HCT: 44 % (ref 39.0–52.0)
HCT: 44 % (ref 39.0–52.0)
HCT: 34 % — ABNORMAL LOW (ref 39.0–52.0)
HCT: 34 % — ABNORMAL LOW (ref 39.0–52.0)
HCT: 34 % — ABNORMAL LOW (ref 39.0–52.0)
HCT: 33 % — ABNORMAL LOW (ref 39.0–52.0)
Hemoglobin: 16 g/dL (ref 13.0–17.0)
Hemoglobin: 15 g/dL (ref 13.0–17.0)
Hemoglobin: 15 g/dL (ref 13.0–17.0)
Hemoglobin: 11.6 g/dL — ABNORMAL LOW (ref 13.0–17.0)
Hemoglobin: 11.6 g/dL — ABNORMAL LOW (ref 13.0–17.0)
Hemoglobin: 11.6 g/dL — ABNORMAL LOW (ref 13.0–17.0)
Hemoglobin: 11.2 g/dL — ABNORMAL LOW (ref 13.0–17.0)
Potassium: 4.3 mmol/L (ref 3.5–5.1)
Potassium: 4.2 mmol/L (ref 3.5–5.1)
Potassium: 4.3 mmol/L (ref 3.5–5.1)
Potassium: 4.4 mmol/L (ref 3.5–5.1)
Potassium: 4.8 mmol/L (ref 3.5–5.1)
Potassium: 4.7 mmol/L (ref 3.5–5.1)
Potassium: 4.3 mmol/L (ref 3.5–5.1)
Sodium: 139 mmol/L (ref 135–145)
Sodium: 138 mmol/L (ref 135–145)
Sodium: 138 mmol/L (ref 135–145)
Sodium: 135 mmol/L (ref 135–145)
Sodium: 135 mmol/L (ref 135–145)
Sodium: 136 mmol/L (ref 135–145)
Sodium: 136 mmol/L (ref 135–145)
TCO2: 26 mmol/L (ref 0–100)
TCO2: 29 mmol/L (ref 0–100)
TCO2: 30 mmol/L (ref 0–100)
TCO2: 26 mmol/L (ref 0–100)
TCO2: 25 mmol/L (ref 0–100)
TCO2: 24 mmol/L (ref 0–100)
TCO2: 24 mmol/L (ref 0–100)

## 2015-09-14 LAB — CBC
HCT: 49.6 % (ref 39.0–52.0)
HCT: 39.9 % (ref 39.0–52.0)
HEMATOCRIT: 41.9 % (ref 39.0–52.0)
Hemoglobin: 16.6 g/dL (ref 13.0–17.0)
Hemoglobin: 14 g/dL (ref 13.0–17.0)
Hemoglobin: 13.4 g/dL (ref 13.0–17.0)
MCH: 29.8 pg (ref 26.0–34.0)
MCH: 29 pg (ref 26.0–34.0)
MCH: 29 pg (ref 26.0–34.0)
MCHC: 33.5 g/dL (ref 30.0–36.0)
MCHC: 33.4 g/dL (ref 30.0–36.0)
MCHC: 33.6 g/dL (ref 30.0–36.0)
MCV: 89 fL (ref 78.0–100.0)
MCV: 86.9 fL (ref 78.0–100.0)
MCV: 86.4 fL (ref 78.0–100.0)
PLATELETS: 109 10*3/uL — AB (ref 150–400)
Platelets: 207 10*3/uL (ref 150–400)
Platelets: 114 10*3/uL — ABNORMAL LOW (ref 150–400)
RBC: 5.57 MIL/uL (ref 4.22–5.81)
RBC: 4.82 MIL/uL (ref 4.22–5.81)
RBC: 4.62 MIL/uL (ref 4.22–5.81)
RDW: 13.5 % (ref 11.5–15.5)
RDW: 13.3 % (ref 11.5–15.5)
RDW: 13.3 % (ref 11.5–15.5)
WBC: 9.7 10*3/uL (ref 4.0–10.5)
WBC: 15.2 10*3/uL — ABNORMAL HIGH (ref 4.0–10.5)
WBC: 13.7 10*3/uL — ABNORMAL HIGH (ref 4.0–10.5)

## 2015-09-14 LAB — BASIC METABOLIC PANEL
Anion gap: 13 (ref 5–15)
BUN: 14 mg/dL (ref 6–20)
CO2: 23 mmol/L (ref 22–32)
Calcium: 9.5 mg/dL (ref 8.9–10.3)
Chloride: 102 mmol/L (ref 101–111)
Creatinine, Ser: 0.99 mg/dL (ref 0.61–1.24)
GFR calc Af Amer: >60 mL/min (ref >60)
GFR calc non Af Amer: >60 mL/min (ref >60)
Glucose, Bld: 112 mg/dL — ABNORMAL HIGH (ref 65–99)
Potassium: 4.2 mmol/L (ref 3.5–5.1)
Sodium: 138 mmol/L (ref 135–145)

## 2015-09-14 LAB — GLUCOSE, CAPILLARY
Glucose-Capillary: 120 mg/dL — ABNORMAL HIGH (ref 65–99)
Glucose-Capillary: 117 mg/dL — ABNORMAL HIGH (ref 65–99)
Glucose-Capillary: 105 mg/dL — ABNORMAL HIGH (ref 65–99)
Glucose-Capillary: 118 mg/dL — ABNORMAL HIGH (ref 65–99)

## 2015-09-14 LAB — POCT I-STAT 4, (NA,K, GLUC, HGB,HCT)
Glucose, Bld: 121 mg/dL — ABNORMAL HIGH (ref 65–99)
HCT: 43 % (ref 39.0–52.0)
Hemoglobin: 14.6 g/dL (ref 13.0–17.0)
Potassium: 4.4 mmol/L (ref 3.5–5.1)
Sodium: 139 mmol/L (ref 135–145)

## 2015-09-14 LAB — HEMOGLOBIN AND HEMATOCRIT, BLOOD
HCT: 33.8 % — ABNORMAL LOW (ref 39.0–52.0)
Hemoglobin: 11.4 g/dL — ABNORMAL LOW (ref 13.0–17.0)

## 2015-09-14 LAB — HEPARIN LEVEL (UNFRACTIONATED): Heparin Unfractionated: 0.36 IU/mL (ref 0.30–0.70)

## 2015-09-14 LAB — PROTIME-INR
INR: 1.46 (ref 0.00–1.49)
PROTHROMBIN TIME: 17.8 s — AB (ref 11.6–15.2)

## 2015-09-14 LAB — CREATININE, SERUM
Creatinine, Ser: 0.97 mg/dL (ref 0.61–1.24)
GFR calc Af Amer: >60 mL/min (ref >60)
GFR calc non Af Amer: >60 mL/min (ref >60)

## 2015-09-14 LAB — MAGNESIUM: Magnesium: 2.8 mg/dL — ABNORMAL HIGH (ref 1.7–2.4)

## 2015-09-14 LAB — PLATELET COUNT: Platelets: 134 10*3/uL — ABNORMAL LOW (ref 150–400)

## 2015-09-14 LAB — APTT: APTT: 33 s (ref 24–37)

## 2015-09-14 LAB — PREPARE RBC (CROSSMATCH)

## 2015-09-14 SURGERY — CORONARY ARTERY BYPASS GRAFTING (CABG)
Anesthesia: General | Site: Chest

## 2015-09-14 MED ORDER — PHENYLEPHRINE 40 MCG/ML (10ML) SYRINGE FOR IV PUSH (FOR BLOOD PRESSURE SUPPORT)
PREFILLED_SYRINGE | INTRAVENOUS | Status: AC
  Filled 2015-09-14: qty 10

## 2015-09-14 MED ORDER — METOPROLOL TARTRATE 25 MG/10 ML ORAL SUSPENSION: 12.5000 mg | Freq: Two times a day (BID) | ORAL | Status: DC

## 2015-09-14 MED ORDER — PROTAMINE SULFATE 10 MG/ML IV SOLN
INTRAVENOUS | Status: AC
  Filled 2015-09-14: qty 25

## 2015-09-14 MED ORDER — ALBUMIN HUMAN 5 % IV SOLN
250.0000 mL | INTRAVENOUS | Status: DC | PRN
  Administered 2015-09-14 – 2015-09-15 (×3): 250 mL via INTRAVENOUS
  Filled 2015-09-14 (×2): qty 250

## 2015-09-14 MED ORDER — MORPHINE SULFATE (PF) 2 MG/ML IV SOLN
2.0000 mg | INTRAVENOUS | Status: DC | PRN
  Administered 2015-09-14 (×2): 2 mg via INTRAVENOUS
  Administered 2015-09-15: 4 mg via INTRAVENOUS
  Filled 2015-09-14 (×2): qty 2

## 2015-09-14 MED ORDER — PROTAMINE SULFATE 10 MG/ML IV SOLN
INTRAVENOUS | Status: AC
  Filled 2015-09-14: qty 5

## 2015-09-14 MED ORDER — LIDOCAINE 2% (20 MG/ML) 5 ML SYRINGE
INTRAMUSCULAR | Status: AC
  Filled 2015-09-14: qty 5

## 2015-09-14 MED ORDER — PHENYLEPHRINE HCL 10 MG/ML IJ SOLN
0.0000 ug/min | INTRAVENOUS | Status: DC
  Administered 2015-09-15: 30 ug/min via INTRAVENOUS
  Filled 2015-09-14 (×2): qty 2

## 2015-09-14 MED ORDER — METOPROLOL TARTRATE 5 MG/5ML IV SOLN: 2.5000 mg | INTRAVENOUS | Status: DC | PRN

## 2015-09-14 MED ORDER — GLYCOPYRROLATE 0.2 MG/ML IJ SOLN
INTRAMUSCULAR | Status: DC | PRN
  Administered 2015-09-14 (×2): 0.2 mg via INTRAVENOUS

## 2015-09-14 MED ORDER — HEPARIN SODIUM (PORCINE) 1000 UNIT/ML IJ SOLN
INTRAMUSCULAR | Status: DC | PRN
  Administered 2015-09-14: 2000 [IU] via INTRAVENOUS
  Administered 2015-09-14: 3000 [IU] via INTRAVENOUS
  Administered 2015-09-14: 29000 [IU] via INTRAVENOUS

## 2015-09-14 MED ORDER — CHLORHEXIDINE GLUCONATE 0.12 % MT SOLN
15.0000 mL | Freq: Two times a day (BID) | OROMUCOSAL | Status: DC
  Administered 2015-09-14 – 2015-09-15 (×2): 15 mL via OROMUCOSAL
  Filled 2015-09-14: qty 15

## 2015-09-14 MED ORDER — LACTATED RINGERS IV SOLN
INTRAVENOUS | Status: DC | PRN
  Administered 2015-09-14 (×4): via INTRAVENOUS

## 2015-09-14 MED ORDER — DIPHENHYDRAMINE HCL 50 MG/ML IJ SOLN
INTRAMUSCULAR | Status: AC
  Filled 2015-09-14: qty 1

## 2015-09-14 MED ORDER — ALBUMIN HUMAN 5 % IV SOLN
INTRAVENOUS | Status: DC | PRN
  Administered 2015-09-14: 14:00:00 via INTRAVENOUS

## 2015-09-14 MED ORDER — ASPIRIN EC 325 MG PO TBEC
325.0000 mg | DELAYED_RELEASE_TABLET | Freq: Every day | ORAL | Status: DC
  Administered 2015-09-15 – 2015-09-18 (×4): 325 mg via ORAL
  Filled 2015-09-14 (×4): qty 1

## 2015-09-14 MED ORDER — PANTOPRAZOLE SODIUM 40 MG PO TBEC
40.0000 mg | DELAYED_RELEASE_TABLET | Freq: Every day | ORAL | Status: DC
  Administered 2015-09-16 – 2015-09-18 (×3): 40 mg via ORAL
  Filled 2015-09-14 (×3): qty 1

## 2015-09-14 MED ORDER — LACTATED RINGERS IV SOLN
500.0000 mL | Freq: Once | INTRAVENOUS | Status: DC | PRN
Start: 2015-09-14 — End: 2015-09-14

## 2015-09-14 MED ORDER — NITROGLYCERIN IN D5W 200-5 MCG/ML-% IV SOLN
7.0000 ug/min | INTRAVENOUS | Status: DC
Start: 2015-09-14 — End: 2015-09-15

## 2015-09-14 MED ORDER — VECURONIUM BROMIDE 10 MG IV SOLR
INTRAVENOUS | Status: AC
  Filled 2015-09-14: qty 10

## 2015-09-14 MED ORDER — METOPROLOL TARTRATE 12.5 MG HALF TABLET
12.5000 mg | ORAL_TABLET | Freq: Two times a day (BID) | ORAL | Status: DC
Start: 2015-09-14 — End: 2015-09-18
  Administered 2015-09-15 – 2015-09-17 (×5): 12.5 mg via ORAL
  Filled 2015-09-14 (×5): qty 1

## 2015-09-14 MED ORDER — FENTANYL CITRATE (PF) 250 MCG/5ML IJ SOLN
INTRAMUSCULAR | Status: AC
  Filled 2015-09-14: qty 20

## 2015-09-14 MED ORDER — ALBUMIN HUMAN 5 % IV SOLN
250.0000 mL | INTRAVENOUS | Status: DC | PRN
  Administered 2015-09-14: 250 mL via INTRAVENOUS

## 2015-09-14 MED ORDER — SODIUM CHLORIDE 0.9% FLUSH
3.0000 mL | Freq: Two times a day (BID) | INTRAVENOUS | Status: DC
  Administered 2015-09-15 – 2015-09-18 (×4): 3 mL via INTRAVENOUS

## 2015-09-14 MED ORDER — ONDANSETRON HCL 4 MG/2ML IJ SOLN: 4.0000 mg | Freq: Four times a day (QID) | INTRAMUSCULAR | Status: DC | PRN

## 2015-09-14 MED ORDER — SODIUM CHLORIDE 0.9 % IV SOLN: 250.0000 mL | INTRAVENOUS | Status: DC

## 2015-09-14 MED ORDER — ACETAMINOPHEN 160 MG/5ML PO SOLN: 1000.0000 mg | Freq: Four times a day (QID) | ORAL | Status: DC

## 2015-09-14 MED ORDER — CALCIUM CHLORIDE 10 % IV SOLN
1.0000 g | Freq: Once | INTRAVENOUS | Status: AC
  Administered 2015-09-14: 1 g via INTRAVENOUS

## 2015-09-14 MED ORDER — GLYCOPYRROLATE 0.2 MG/ML IV SOSY
PREFILLED_SYRINGE | INTRAVENOUS | Status: AC
  Filled 2015-09-14: qty 3

## 2015-09-14 MED ORDER — EPHEDRINE 5 MG/ML INJ
INTRAVENOUS | Status: AC
  Filled 2015-09-14: qty 10

## 2015-09-14 MED ORDER — EPHEDRINE SULFATE 50 MG/ML IJ SOLN
INTRAMUSCULAR | Status: DC | PRN
  Administered 2015-09-14: 5 mg via INTRAVENOUS

## 2015-09-14 MED ORDER — INSULIN REGULAR HUMAN 100 UNIT/ML IJ SOLN
INTRAMUSCULAR | Status: DC
  Filled 2015-09-14: qty 2.5

## 2015-09-14 MED ORDER — ROCURONIUM BROMIDE 100 MG/10ML IV SOLN
INTRAVENOUS | Status: DC | PRN
  Administered 2015-09-14: 90 mg via INTRAVENOUS
  Administered 2015-09-14: 10 mg via INTRAVENOUS
  Administered 2015-09-14: 30 mg via INTRAVENOUS

## 2015-09-14 MED ORDER — SUCCINYLCHOLINE CHLORIDE 20 MG/ML IJ SOLN
INTRAMUSCULAR | Status: DC | PRN
  Administered 2015-09-14: 100 mg via INTRAVENOUS

## 2015-09-14 MED ORDER — INSULIN ASPART 100 UNIT/ML ~~LOC~~ SOLN
0.0000 [IU] | SUBCUTANEOUS | Status: DC
  Administered 2015-09-14 – 2015-09-15 (×2): 2 [IU] via SUBCUTANEOUS

## 2015-09-14 MED ORDER — VECURONIUM BROMIDE 10 MG IV SOLR
INTRAVENOUS | Status: DC | PRN
  Administered 2015-09-14 (×4): 5 mg via INTRAVENOUS

## 2015-09-14 MED ORDER — MIDAZOLAM HCL 5 MG/ML IJ SOLN
INTRAMUSCULAR | Status: DC | PRN
  Administered 2015-09-14: 3 mg via INTRAVENOUS
  Administered 2015-09-14 (×2): 2 mg via INTRAVENOUS
  Administered 2015-09-14: 3 mg via INTRAVENOUS

## 2015-09-14 MED ORDER — HEMOSTATIC AGENTS (NO CHARGE) OPTIME
TOPICAL | Status: DC | PRN
  Administered 2015-09-14: 1 via TOPICAL

## 2015-09-14 MED ORDER — SODIUM CHLORIDE 0.9 % IJ SOLN
OROMUCOSAL | Status: DC | PRN
  Administered 2015-09-14 (×3): via TOPICAL

## 2015-09-14 MED ORDER — FENTANYL CITRATE (PF) 250 MCG/5ML IJ SOLN
INTRAMUSCULAR | Status: AC
  Filled 2015-09-14: qty 5

## 2015-09-14 MED ORDER — SODIUM CHLORIDE 0.9% FLUSH: 3.0000 mL | INTRAVENOUS | Status: DC | PRN

## 2015-09-14 MED ORDER — DEXTROSE 5 % IV SOLN
1.5000 g | Freq: Two times a day (BID) | INTRAVENOUS | Status: AC
  Administered 2015-09-14 – 2015-09-16 (×4): 1.5 g via INTRAVENOUS
  Filled 2015-09-14 (×4): qty 1.5

## 2015-09-14 MED ORDER — KETOROLAC TROMETHAMINE 30 MG/ML IJ SOLN
30.0000 mg | Freq: Four times a day (QID) | INTRAMUSCULAR | Status: DC
  Administered 2015-09-14 – 2015-09-16 (×7): 30 mg via INTRAVENOUS
  Filled 2015-09-14 (×7): qty 1

## 2015-09-14 MED ORDER — OXYCODONE HCL 5 MG PO TABS
5.0000 mg | ORAL_TABLET | ORAL | Status: DC | PRN
  Administered 2015-09-16: 10 mg via ORAL
  Filled 2015-09-14: qty 2

## 2015-09-14 MED ORDER — BISACODYL 5 MG PO TBEC
10.0000 mg | DELAYED_RELEASE_TABLET | Freq: Every day | ORAL | Status: DC
  Administered 2015-09-15 – 2015-09-18 (×4): 10 mg via ORAL
  Filled 2015-09-14 (×4): qty 2

## 2015-09-14 MED ORDER — PROPOFOL 10 MG/ML IV BOLUS
INTRAVENOUS | Status: DC | PRN
  Administered 2015-09-14: 70 mg via INTRAVENOUS
  Administered 2015-09-14: 30 mg via INTRAVENOUS

## 2015-09-14 MED ORDER — LACTATED RINGERS IV SOLN: INTRAVENOUS | Status: DC

## 2015-09-14 MED ORDER — DOPAMINE-DEXTROSE 3.2-5 MG/ML-% IV SOLN
0.0000 ug/kg/min | INTRAVENOUS | Status: DC
Start: 2015-09-14 — End: 2015-09-15

## 2015-09-14 MED ORDER — LIDOCAINE HCL (CARDIAC) 20 MG/ML IV SOLN
INTRAVENOUS | Status: DC | PRN
  Administered 2015-09-14: 100 mg via INTRAVENOUS

## 2015-09-14 MED ORDER — SODIUM BICARBONATE 8.4 % IV SOLN: 50.0000 meq | Freq: Once | INTRAVENOUS | Status: AC

## 2015-09-14 MED ORDER — POTASSIUM CHLORIDE 10 MEQ/50ML IV SOLN: 10.0000 meq | INTRAVENOUS | Status: AC

## 2015-09-14 MED ORDER — CALCIUM CHLORIDE 10 % IV SOLN
INTRAVENOUS | Status: DC | PRN
  Administered 2015-09-14: 250 mg via INTRAVENOUS
  Administered 2015-09-14: 200 mg via INTRAVENOUS
  Administered 2015-09-14: 100 mg via INTRAVENOUS
  Administered 2015-09-14: 200 mg via INTRAVENOUS
  Administered 2015-09-14: 250 mg via INTRAVENOUS

## 2015-09-14 MED ORDER — ACETAMINOPHEN 500 MG PO TABS
1000.0000 mg | ORAL_TABLET | Freq: Four times a day (QID) | ORAL | Status: DC
  Administered 2015-09-14 – 2015-09-18 (×12): 1000 mg via ORAL
  Filled 2015-09-14 (×11): qty 2

## 2015-09-14 MED ORDER — ROCURONIUM BROMIDE 50 MG/5ML IV SOLN
INTRAVENOUS | Status: AC
  Filled 2015-09-14: qty 2

## 2015-09-14 MED ORDER — SODIUM CHLORIDE 0.9% FLUSH
10.0000 mL | Freq: Two times a day (BID) | INTRAVENOUS | Status: DC
  Administered 2015-09-14 – 2015-09-15 (×3): 10 mL

## 2015-09-14 MED ORDER — DOCUSATE SODIUM 100 MG PO CAPS
200.0000 mg | ORAL_CAPSULE | Freq: Every day | ORAL | Status: DC
  Administered 2015-09-15 – 2015-09-18 (×4): 200 mg via ORAL
  Filled 2015-09-14 (×4): qty 2

## 2015-09-14 MED ORDER — CETYLPYRIDINIUM CHLORIDE 0.05 % MT LIQD
7.0000 mL | Freq: Two times a day (BID) | OROMUCOSAL | Status: DC
  Administered 2015-09-15: 7 mL via OROMUCOSAL

## 2015-09-14 MED ORDER — FENTANYL CITRATE (PF) 250 MCG/5ML IJ SOLN
INTRAMUSCULAR | Status: DC | PRN
  Administered 2015-09-14: 50 ug via INTRAVENOUS
  Administered 2015-09-14: 200 ug via INTRAVENOUS
  Administered 2015-09-14: 300 ug via INTRAVENOUS
  Administered 2015-09-14 (×9): 50 ug via INTRAVENOUS
  Administered 2015-09-14: 250 ug via INTRAVENOUS
  Administered 2015-09-14: 150 ug via INTRAVENOUS
  Administered 2015-09-14: 100 ug via INTRAVENOUS

## 2015-09-14 MED ORDER — MIDAZOLAM HCL 10 MG/2ML IJ SOLN
INTRAMUSCULAR | Status: AC
  Filled 2015-09-14: qty 2

## 2015-09-14 MED ORDER — SODIUM BICARBONATE 8.4 % IV SOLN
25.0000 meq | Freq: Once | INTRAVENOUS | Status: AC
  Administered 2015-09-14: 25 meq via INTRAVENOUS

## 2015-09-14 MED ORDER — FAMOTIDINE IN NACL 20-0.9 MG/50ML-% IV SOLN
20.0000 mg | Freq: Two times a day (BID) | INTRAVENOUS | Status: DC
  Administered 2015-09-14: 20 mg via INTRAVENOUS

## 2015-09-14 MED ORDER — ACETAMINOPHEN 160 MG/5ML PO SOLN: 650.0000 mg | Freq: Once | ORAL | Status: AC

## 2015-09-14 MED ORDER — MAGNESIUM SULFATE 4 GM/100ML IV SOLN
4.0000 g | Freq: Once | INTRAVENOUS | Status: AC
  Administered 2015-09-14: 4 g via INTRAVENOUS
  Filled 2015-09-14: qty 100

## 2015-09-14 MED ORDER — MORPHINE SULFATE (PF) 2 MG/ML IV SOLN: 1.0000 mg | INTRAVENOUS | Status: DC | PRN

## 2015-09-14 MED ORDER — LIDOCAINE HCL (CARDIAC) 20 MG/ML IV SOLN
100.0000 mg | Freq: Once | INTRAVENOUS | Status: AC
  Administered 2015-09-14: 100 mg via INTRAVENOUS

## 2015-09-14 MED ORDER — VANCOMYCIN HCL IN DEXTROSE 1-5 GM/200ML-% IV SOLN
1000.0000 mg | Freq: Once | INTRAVENOUS | Status: DC
  Filled 2015-09-14: qty 200

## 2015-09-14 MED ORDER — BISACODYL 10 MG RE SUPP: 10.0000 mg | Freq: Every day | RECTAL | Status: DC

## 2015-09-14 MED ORDER — PROPOFOL 10 MG/ML IV BOLUS
INTRAVENOUS | Status: AC
  Filled 2015-09-14: qty 20

## 2015-09-14 MED ORDER — MIDAZOLAM HCL 2 MG/2ML IJ SOLN: 2.0000 mg | INTRAMUSCULAR | Status: DC | PRN

## 2015-09-14 MED ORDER — ASPIRIN 81 MG PO CHEW: 324.0000 mg | CHEWABLE_TABLET | Freq: Every day | ORAL | Status: DC

## 2015-09-14 MED ORDER — TRAMADOL HCL 50 MG PO TABS
50.0000 mg | ORAL_TABLET | ORAL | Status: DC | PRN
  Administered 2015-09-15 – 2015-09-16 (×4): 100 mg via ORAL
  Administered 2015-09-17: 50 mg via ORAL
  Administered 2015-09-17 – 2015-09-18 (×3): 100 mg via ORAL
  Filled 2015-09-14 (×3): qty 2
  Filled 2015-09-14: qty 1
  Filled 2015-09-14 (×4): qty 2

## 2015-09-14 MED ORDER — SODIUM CHLORIDE 0.9 % IJ SOLN
INTRAMUSCULAR | Status: AC
  Filled 2015-09-14: qty 20

## 2015-09-14 MED ORDER — VANCOMYCIN HCL IN DEXTROSE 1-5 GM/200ML-% IV SOLN
1000.0000 mg | Freq: Two times a day (BID) | INTRAVENOUS | Status: AC
  Administered 2015-09-14 – 2015-09-15 (×2): 1000 mg via INTRAVENOUS
  Filled 2015-09-14 (×2): qty 200

## 2015-09-14 MED ORDER — SODIUM CHLORIDE 0.9% FLUSH
10.0000 mL | INTRAVENOUS | Status: DC | PRN
  Administered 2015-09-14 (×2): 20 mL
  Filled 2015-09-14 (×2): qty 40

## 2015-09-14 MED ORDER — CHLORHEXIDINE GLUCONATE 0.12 % MT SOLN
15.0000 mL | OROMUCOSAL | Status: AC
  Administered 2015-09-14: 15 mL via OROMUCOSAL
  Filled 2015-09-14: qty 15

## 2015-09-14 MED ORDER — HEPARIN SODIUM (PORCINE) 1000 UNIT/ML IJ SOLN
INTRAMUSCULAR | Status: AC
  Filled 2015-09-14: qty 1

## 2015-09-14 MED ORDER — PROTAMINE SULFATE 10 MG/ML IV SOLN
INTRAVENOUS | Status: DC | PRN
  Administered 2015-09-14 (×2): 50 mg via INTRAVENOUS
  Administered 2015-09-14: 40 mg via INTRAVENOUS
  Administered 2015-09-14: 50 mg via INTRAVENOUS
  Administered 2015-09-14: 10 mg via INTRAVENOUS
  Administered 2015-09-14 (×2): 50 mg via INTRAVENOUS

## 2015-09-14 MED ORDER — SUCCINYLCHOLINE CHLORIDE 200 MG/10ML IV SOSY
PREFILLED_SYRINGE | INTRAVENOUS | Status: AC
  Filled 2015-09-14: qty 10

## 2015-09-14 MED ORDER — SODIUM CHLORIDE 0.45 % IV SOLN
INTRAVENOUS | Status: DC | PRN
Start: 2015-09-14 — End: 2015-09-18
  Administered 2015-09-14: 17:00:00 via INTRAVENOUS

## 2015-09-14 MED ORDER — ACETAMINOPHEN 650 MG RE SUPP
650.0000 mg | Freq: Once | RECTAL | Status: AC
  Administered 2015-09-14: 650 mg via RECTAL

## 2015-09-14 MED ORDER — ATORVASTATIN CALCIUM 40 MG PO TABS
40.0000 mg | ORAL_TABLET | Freq: Every day | ORAL | Status: DC
  Administered 2015-09-15: 40 mg via ORAL
  Filled 2015-09-14: qty 1

## 2015-09-14 MED ORDER — 0.9 % SODIUM CHLORIDE (POUR BTL) OPTIME
TOPICAL | Status: DC | PRN
Start: 2015-09-14 — End: 2015-09-14
  Administered 2015-09-14: 6000 mL

## 2015-09-14 MED ORDER — INSULIN REGULAR BOLUS VIA INFUSION
0.0000 [IU] | Freq: Three times a day (TID) | INTRAVENOUS | Status: DC
  Filled 2015-09-14: qty 10

## 2015-09-14 MED ORDER — DEXMEDETOMIDINE HCL IN NACL 200 MCG/50ML IV SOLN
0.0000 ug/kg/h | INTRAVENOUS | Status: DC
  Administered 2015-09-14: 0.7 ug/kg/h via INTRAVENOUS
  Filled 2015-09-14 (×2): qty 50

## 2015-09-14 MED ORDER — ISOSORBIDE MONONITRATE ER 30 MG PO TB24
15.0000 mg | ORAL_TABLET | Freq: Every day | ORAL | Status: DC
  Administered 2015-09-15 – 2015-09-18 (×4): 15 mg via ORAL
  Filled 2015-09-14 (×4): qty 1

## 2015-09-14 MED ORDER — CALCIUM CHLORIDE 10 % IV SOLN
INTRAVENOUS | Status: AC
  Filled 2015-09-14: qty 10

## 2015-09-14 MED ORDER — SODIUM CHLORIDE 0.9 % IV SOLN
INTRAVENOUS | Status: DC
  Administered 2015-09-14: 17:00:00 via INTRAVENOUS

## 2015-09-14 MED ORDER — DIPHENHYDRAMINE HCL 50 MG/ML IJ SOLN
INTRAMUSCULAR | Status: DC | PRN
Start: 2015-09-14 — End: 2015-09-14
  Administered 2015-09-14: 50 mg via INTRAVENOUS

## 2015-09-14 MED FILL — Heparin Sodium (Porcine) Inj 1000 Unit/ML: INTRAMUSCULAR | Qty: 30 | Status: AC

## 2015-09-14 MED FILL — Potassium Chloride Inj 2 mEq/ML: INTRAVENOUS | Qty: 40 | Status: AC

## 2015-09-14 MED FILL — Magnesium Sulfate Inj 50%: INTRAMUSCULAR | Qty: 10 | Status: AC

## 2015-09-14 MED FILL — Electrolyte-R (PH 7.4) Solution: INTRAVENOUS | Qty: 6000 | Status: AC

## 2015-09-14 MED FILL — Sodium Bicarbonate IV Soln 8.4%: INTRAVENOUS | Qty: 50 | Status: AC

## 2015-09-14 MED FILL — Sodium Chloride IV Soln 0.9%: INTRAVENOUS | Qty: 3000 | Status: AC

## 2015-09-14 MED FILL — Lidocaine HCl IV Inj 20 MG/ML: INTRAVENOUS | Qty: 10 | Status: AC

## 2015-09-14 MED FILL — Mannitol IV Soln 20%: INTRAVENOUS | Qty: 500 | Status: AC

## 2015-09-14 SURGICAL SUPPLY — 119 items
ADAPTER CARDIO PERF ANTE/RETRO (ADAPTER) ×5 IMPLANT
APPLIER CLIP 9.375 SM OPEN (CLIP) ×5
BAG DECANTER FOR FLEXI CONT (MISCELLANEOUS) ×5 IMPLANT
BANDAGE ELASTIC 4 VELCRO ST LF (GAUZE/BANDAGES/DRESSINGS) ×10 IMPLANT
BANDAGE ELASTIC 6 VELCRO ST LF (GAUZE/BANDAGES/DRESSINGS) ×5 IMPLANT
BASKET HEART  (ORDER IN 25'S) (MISCELLANEOUS) ×1
BASKET HEART (ORDER IN 25'S) (MISCELLANEOUS) ×1
BASKET HEART (ORDER IN 25S) (MISCELLANEOUS) ×3 IMPLANT
BLADE NEEDLE 3 SS STRL (BLADE) ×4 IMPLANT
BLADE NEEDLE 3MM SS STRL (BLADE) ×1
BLADE STERNUM SYSTEM 6 (BLADE) ×5 IMPLANT
BLADE SURG 11 STRL SS (BLADE) ×5 IMPLANT
BLADE SURG 12 STRL SS (BLADE) ×5 IMPLANT
BLADE SURG 15 STRL LF DISP TIS (BLADE) ×6 IMPLANT
BLADE SURG 15 STRL SS (BLADE) ×4
BLADE SURG ROTATE 9660 (MISCELLANEOUS) IMPLANT
BNDG GAUZE ELAST 4 BULKY (GAUZE/BANDAGES/DRESSINGS) ×10 IMPLANT
CANISTER SUCTION 2500CC (MISCELLANEOUS) ×5 IMPLANT
CANNULA ARTERIAL NVNT 3/8 22FR (MISCELLANEOUS) ×5 IMPLANT
CANNULA GUNDRY RCSP 15FR (MISCELLANEOUS) ×10 IMPLANT
CATH CPB KIT VANTRIGT (MISCELLANEOUS) ×5 IMPLANT
CATH ROBINSON RED A/P 18FR (CATHETERS) ×15 IMPLANT
CATH THORACIC 36FR RT ANG (CATHETERS) ×5 IMPLANT
CLIP APPLIE 9.375 SM OPEN (CLIP) ×3 IMPLANT
CLIP FOGARTY SPRING 6M (CLIP) ×10 IMPLANT
CLIP TI MEDIUM 24 (CLIP) ×5 IMPLANT
CLIP TI WIDE RED SMALL 24 (CLIP) ×10 IMPLANT
COVER MAYO STAND STRL (DRAPES) ×5 IMPLANT
COVER SURGICAL LIGHT HANDLE (MISCELLANEOUS) ×5 IMPLANT
CRADLE DONUT ADULT HEAD (MISCELLANEOUS) ×5 IMPLANT
DRAIN CHANNEL 32F RND 10.7 FF (WOUND CARE) ×5 IMPLANT
DRAPE CARDIOVASCULAR INCISE (DRAPES) ×2
DRAPE EXTREMITY T 121X128X90 (DRAPE) ×5 IMPLANT
DRAPE PROXIMA HALF (DRAPES) ×5 IMPLANT
DRAPE SLUSH/WARMER DISC (DRAPES) ×5 IMPLANT
DRAPE SRG 135X102X78XABS (DRAPES) ×3 IMPLANT
DRSG AQUACEL AG ADV 3.5X14 (GAUZE/BANDAGES/DRESSINGS) ×5 IMPLANT
ELECT BLADE 4.0 EZ CLEAN MEGAD (MISCELLANEOUS) ×5
ELECT BLADE 6.5 EXT (BLADE) ×10 IMPLANT
ELECT CAUTERY BLADE 6.4 (BLADE) ×5 IMPLANT
ELECT REM PT RETURN 9FT ADLT (ELECTROSURGICAL) ×10
ELECTRODE BLDE 4.0 EZ CLN MEGD (MISCELLANEOUS) ×3 IMPLANT
ELECTRODE REM PT RTRN 9FT ADLT (ELECTROSURGICAL) ×6 IMPLANT
FELT TEFLON 1X6 (MISCELLANEOUS) ×10 IMPLANT
GAUZE SPONGE 4X4 12PLY STRL (GAUZE/BANDAGES/DRESSINGS) ×10 IMPLANT
GEL ULTRASOUND 20GR AQUASONIC (MISCELLANEOUS) ×5 IMPLANT
GLOVE BIO SURGEON STRL SZ7.5 (GLOVE) ×15 IMPLANT
GLOVE BIOGEL PI IND STRL 6 (GLOVE) ×12 IMPLANT
GLOVE BIOGEL PI IND STRL 6.5 (GLOVE) ×24 IMPLANT
GLOVE BIOGEL PI IND STRL 7.5 (GLOVE) ×18 IMPLANT
GLOVE BIOGEL PI INDICATOR 6 (GLOVE) ×8
GLOVE BIOGEL PI INDICATOR 6.5 (GLOVE) ×16
GLOVE BIOGEL PI INDICATOR 7.5 (GLOVE) ×12
GOWN STRL REUS W/ TWL LRG LVL3 (GOWN DISPOSABLE) ×12 IMPLANT
GOWN STRL REUS W/TWL LRG LVL3 (GOWN DISPOSABLE) ×8
HARMONIC SHEARS 14CM COAG (MISCELLANEOUS) ×5 IMPLANT
HEMOSTAT POWDER SURGIFOAM 1G (HEMOSTASIS) ×15 IMPLANT
HEMOSTAT SURGICEL 2X14 (HEMOSTASIS) ×10 IMPLANT
INSERT FOGARTY XLG (MISCELLANEOUS) ×5 IMPLANT
KIT BASIN OR (CUSTOM PROCEDURE TRAY) ×5 IMPLANT
KIT ROOM TURNOVER OR (KITS) ×5 IMPLANT
KIT SUCTION CATH 14FR (SUCTIONS) ×5 IMPLANT
KIT VASOVIEW 6 PRO VH 2400 (KITS) ×5 IMPLANT
LEAD PACING MYOCARDI (MISCELLANEOUS) ×5 IMPLANT
MARKER GRAFT CORONARY BYPASS (MISCELLANEOUS) ×15 IMPLANT
NS IRRIG 1000ML POUR BTL (IV SOLUTION) ×25 IMPLANT
PACK OPEN HEART (CUSTOM PROCEDURE TRAY) ×5 IMPLANT
PAD ARMBOARD 7.5X6 YLW CONV (MISCELLANEOUS) ×10 IMPLANT
PAD ELECT DEFIB RADIOL ZOLL (MISCELLANEOUS) ×5 IMPLANT
PENCIL BUTTON HOLSTER BLD 10FT (ELECTRODE) ×5 IMPLANT
PUNCH AORTIC ROTATE  4.5MM 8IN (MISCELLANEOUS) ×5 IMPLANT
PUNCH AORTIC ROTATE 4.0MM (MISCELLANEOUS) IMPLANT
PUNCH AORTIC ROTATE 4.5MM 8IN (MISCELLANEOUS) IMPLANT
PUNCH AORTIC ROTATE 5MM 8IN (MISCELLANEOUS) IMPLANT
SET CARDIOPLEGIA MPS 5001102 (MISCELLANEOUS) ×5 IMPLANT
SOLUTION ANTI FOG 6CC (MISCELLANEOUS) ×5 IMPLANT
SPONGE GAUZE 4X4 12PLY STER LF (GAUZE/BANDAGES/DRESSINGS) ×5 IMPLANT
SPONGE LAP 18X18 X RAY DECT (DISPOSABLE) ×10 IMPLANT
SPONGE LAP 4X18 X RAY DECT (DISPOSABLE) ×5 IMPLANT
STAPLER VISISTAT 35W (STAPLE) ×5 IMPLANT
SURGIFLO W/THROMBIN 8M KIT (HEMOSTASIS) ×10 IMPLANT
SUT BONE WAX W31G (SUTURE) ×5 IMPLANT
SUT MNCRL AB 4-0 PS2 18 (SUTURE) IMPLANT
SUT PROLENE 3 0 SH DA (SUTURE) IMPLANT
SUT PROLENE 3 0 SH1 36 (SUTURE) IMPLANT
SUT PROLENE 4 0 RB 1 (SUTURE) ×2
SUT PROLENE 4 0 SH DA (SUTURE) ×10 IMPLANT
SUT PROLENE 4-0 RB1 .5 CRCL 36 (SUTURE) ×3 IMPLANT
SUT PROLENE 5 0 C 1 36 (SUTURE) IMPLANT
SUT PROLENE 6 0 C 1 30 (SUTURE) ×15 IMPLANT
SUT PROLENE 6 0 CC (SUTURE) ×15 IMPLANT
SUT PROLENE 7 0 BV 1 (SUTURE) ×5 IMPLANT
SUT PROLENE 8 0 BV175 6 (SUTURE) ×30 IMPLANT
SUT PROLENE BLUE 7 0 (SUTURE) ×10 IMPLANT
SUT SILK  1 MH (SUTURE)
SUT SILK 1 MH (SUTURE) IMPLANT
SUT SILK 2 0 SH CR/8 (SUTURE) ×5 IMPLANT
SUT SILK 3 0 SH CR/8 (SUTURE) IMPLANT
SUT STEEL 6MS V (SUTURE) ×10 IMPLANT
SUT STEEL SZ 6 DBL 3X14 BALL (SUTURE) ×5 IMPLANT
SUT VIC AB 1 CTX 36 (SUTURE) ×12
SUT VIC AB 1 CTX36XBRD ANBCTR (SUTURE) ×18 IMPLANT
SUT VIC AB 2-0 CT1 27 (SUTURE) ×8
SUT VIC AB 2-0 CT1 TAPERPNT 27 (SUTURE) ×12 IMPLANT
SUT VIC AB 2-0 CTX 27 (SUTURE) IMPLANT
SUT VIC AB 3-0 SH 27 (SUTURE)
SUT VIC AB 3-0 SH 27X BRD (SUTURE) IMPLANT
SUT VIC AB 3-0 X1 27 (SUTURE) ×5 IMPLANT
SUTURE E-PAK OPEN HEART (SUTURE) ×5 IMPLANT
SYR 50ML SLIP (SYRINGE) IMPLANT
SYSTEM SAHARA CHEST DRAIN ATS (WOUND CARE) ×5 IMPLANT
TAPE CLOTH SURG 4X10 WHT LF (GAUZE/BANDAGES/DRESSINGS) ×5 IMPLANT
TAPE CLOTH SURG 6X10 WHT LF (GAUZE/BANDAGES/DRESSINGS) ×5 IMPLANT
TOWEL OR 17X24 6PK STRL BLUE (TOWEL DISPOSABLE) ×10 IMPLANT
TOWEL OR 17X26 10 PK STRL BLUE (TOWEL DISPOSABLE) ×10 IMPLANT
TRAY FOLEY IC TEMP SENS 16FR (CATHETERS) ×5 IMPLANT
TUBING INSUFFLATION (TUBING) ×5 IMPLANT
UNDERPAD 30X30 INCONTINENT (UNDERPADS AND DIAPERS) ×5 IMPLANT
WATER STERILE IRR 1000ML POUR (IV SOLUTION) ×10 IMPLANT

## 2015-09-14 NOTE — Transfer of Care (Signed)
Immediate Anesthesia Transfer of Care Note  Patient: Billy Waters  Procedure(s) Performed: Procedure(s): CORONARY ARTERY BYPASS GRAFTING times 5 using left internal mammary artery, left radial artery, and right greater saphenous vein harvested by endovein (N/A) TRANSESOPHAGEAL ECHOCARDIOGRAM (TEE) (N/A) LEFT RADIAL ARTERY HARVEST (Left)  Patient Location: SICU  Anesthesia Type:General  Level of Consciousness: sedated, unresponsive and Patient remains intubated per anesthesia plan  Airway & Oxygen Therapy: Patient remains intubated per anesthesia plan and Patient placed on Ventilator (see vital sign flow sheet for setting)  Post-op Assessment: Report given to RN and Post -op Vital signs reviewed and stable  Post vital signs: Reviewed and stable  Last Vitals:  Filed Vitals:   09/13/15 1955 09/14/15 0457  BP: 135/62 127/57  Pulse: 58 57  Temp: 36.8 C 36.8 C  Resp: 19 16    Last Pain:  Filed Vitals:   09/14/15 0458  PainSc: 0-No pain         Complications: No apparent anesthesia complications

## 2015-09-14 NOTE — Progress Notes (Signed)
Initiated open heart rapid wean per protocol. RT will continue to monitor.  

## 2015-09-14 NOTE — OR Nursing (Signed)
Twenty minute call to SICU charge nurse at 1440. Spoke with Billy Waters.

## 2015-09-14 NOTE — Brief Op Note (Signed)
09/10/2015 - 09/14/2015  12:48 PM      [address]       [place] [id number]             [phone]     09/10/2015 - 09/14/2015  12:49 PM  PATIENT:  Ludwig ClarksApostolos G Nelon  55 y.o. male  PRE-OPERATIVE DIAGNOSIS:  CAD  POST-OPERATIVE DIAGNOSIS:  CAD  PROCEDURE:  Procedure(s): CORONARY ARTERY BYPASS GRAFTING X5 LIMA-LAD; SVG-DIAG; SEQ SVG-PD-PL; LEFT RADIAL-CX RANSESOPHAGEAL ECHOCARDIOGRAM (TEE) LEFT RADIAL ARTERY HARVEST EVH RIGHT THIGH GREATER SAPHENOUS VEIN  SURGEON:  Surgeon(s): Kerin PernaPeter Van Trigt, MD  PHYSICIAN ASSISTANT: Rashon Rezek PA-C  ANESTHESIA:   general  PATIENT CONDITION:  ICU - intubated and hemodynamically stable.  PRE-OPERATIVE WEIGHT: 121kg  COMPLICATIONS: NO KNOWN

## 2015-09-14 NOTE — Anesthesia Procedure Notes (Addendum)
Central Venous Catheter Insertion Performed by: anesthesiologist Patient location: Pre-op. Preanesthetic checklist: patient identified, IV checked, site marked, risks and benefits discussed, surgical consent, monitors and equipment checked, pre-op evaluation, timeout performed and anesthesia consent Position: Trendelenburg Lidocaine 1% used for infiltration Landmarks identified and Seldinger technique used Catheter size: 8.5 Fr Central line was placed.Sheath introducer Procedure performed using ultrasound guided technique. Attempts: 1 Following insertion, line sutured and dressing applied. Post procedure assessment: blood return through all ports, free fluid flow and no air. Patient tolerated the procedure well with no immediate complications.    Central Venous Catheter Insertion Performed by: anesthesiologist Patient location: Pre-op. Preanesthetic checklist: patient identified, IV checked, site marked, risks and benefits discussed, surgical consent, monitors and equipment checked, pre-op evaluation, timeout performed and anesthesia consent Landmarks identified PA cath was placed.Swan type and PA catheter depth:thermodilationProcedure performed without using ultrasound guided technique. Attempts: 1 Following insertion, dressing applied. Patient tolerated the procedure well with no immediate complications.    Procedure Name: Intubation Date/Time: 09/14/2015 7:44 AM Performed by: Lucinda DellECARLO, Abdoul Encinas M Pre-anesthesia Checklist: Patient identified, Emergency Drugs available, Suction available and Patient being monitored Patient Re-evaluated:Patient Re-evaluated prior to inductionOxygen Delivery Method: Circle system utilized Preoxygenation: Pre-oxygenation with 100% oxygen Intubation Type: IV induction Ventilation: Mask ventilation with difficulty and Oral airway inserted - appropriate to patient size Laryngoscope Size: Mac and 4 Grade View: Grade II Tube type: Oral Tube size: 8.0  mm Number of attempts: 1 Airway Equipment and Method: Stylet Placement Confirmation: ETT inserted through vocal cords under direct vision,  positive ETCO2 and breath sounds checked- equal and bilateral Secured at: 22 cm Tube secured with: Tape Dental Injury: Teeth and Oropharynx as per pre-operative assessment  Comments: Somewhat difficult mask even with oral airway in place. Recommend induction with anectine. Grade II view on DL. AOI.

## 2015-09-14 NOTE — Progress Notes (Signed)
Patient ID: Billy Waters, male   DOB: December 02, 1960, 55 y.o.   MRN: [id number] EVENING ROUNDS NOTE :     [address]       Billy Waters,[place] [id number]             [phone]                 Day of Surgery Procedure(s) (LRB): CORONARY ARTERY BYPASS GRAFTING times 5 using left internal mammary artery, left radial artery, and right greater saphenous vein harvested by endovein (N/A) TRANSESOPHAGEAL ECHOCARDIOGRAM (TEE) (N/A) LEFT RADIAL ARTERY HARVEST (Left)  Total Length of Stay:  LOS: 4 days  BP 106/56 mmHg  Pulse 96  Temp(Src) 99.7 F (37.6 C) (Core (Comment))  Resp 20  Ht 5\' 10"  (1.778 m)  Wt 267 lb 6.7 oz (121.3 kg)  BMI 38.37 kg/m2  SpO2 96%  .Intake/Output      05/01 0701 - 05/02 0700   I.V. (mL/kg) 2663.6 (22)   Blood 1035   NG/GT 30   IV Piggyback 1110   Total Intake(mL/kg) 4838.6 (39.9)   Urine (mL/kg/hr) 3245 (2.1)   Emesis/NG output 230 (0.1)   Blood 1635 (1)   Total Output 5110   Net -271.4         . sodium chloride 20 mL/hr at 09/14/15 1644  . [START ON 09/15/2015] sodium chloride    . sodium chloride 20 mL/hr at 09/14/15 1725  . dexmedetomidine 0.2 mcg/kg/hr (09/14/15 1800)  . DOPamine 2.5 mcg/kg/min (09/14/15 1800)  . insulin (NOVOLIN-R) infusion 0.9 Units/hr (09/14/15 1900)  . lactated ringers 20 mL/hr at 09/14/15 1545  . lactated ringers    . nitroGLYCERIN 5 mcg/min (09/14/15 1800)  . phenylephrine (NEO-SYNEPHRINE) Adult infusion 10 mcg/min (09/14/15 1900)     Lab Results  Component Value Date   WBC 15.2* 09/14/2015   HGB 14.6 09/14/2015   HCT 43.0 09/14/2015   PLT 109* 09/14/2015   GLUCOSE 121* 09/14/2015   NA 139 09/14/2015   K 4.4 09/14/2015   CL 102 09/14/2015   CREATININE 0.80 09/14/2015   BUN 13 09/14/2015   CO2 23 09/14/2015   INR 1.46 09/14/2015   Stable post op Neuro intact Not bleeding  Delight OvensEdward B Shulamis Wenberg MD  Beeper [phone] Office [phone] 09/14/2015 8:00 PM

## 2015-09-14 NOTE — Procedures (Signed)
Extubation Procedure Note  Patient Details:   Name: Billy Waters DOB: [birth year] MRN: [id number]   Airway Documentation:     Evaluation  O2 sats: stable throughout Complications: No apparent complications Patient did tolerate procedure well. Bilateral Breath Sounds: Diminished   Yes   Pt extubated to 3L [place] per Open Heart Rapid Wean Protocol. Pt ABG within normal limits, NIF -30, VC 0.7. Pt stable throughout with no complications. Pt able to speak and has a weak non-productive cough post extubation. RT will continue to monitor.   Carolan ShiverKelley, Shatima Zalar M 09/14/2015, 6:59 PM

## 2015-09-14 NOTE — Progress Notes (Signed)
  Echocardiogram Echocardiogram Transesophageal has been performed.  Billy SavoyCasey N Jassiel Waters 09/14/2015, 8:17 AM

## 2015-09-14 NOTE — Progress Notes (Signed)
The patient was examined and preop studies reviewed. There has been no change from the prior exam and the patient is ready for surgery. Plan CABG on Billy Waters

## 2015-09-14 NOTE — OR Nursing (Addendum)
T.Rosely Fernandez, RN charting [phone].

## 2015-09-14 NOTE — Anesthesia Postprocedure Evaluation (Signed)
Anesthesia Post Note  Patient: Billy Waters  Procedure(s) Performed: Procedure(s) (LRB): CORONARY ARTERY BYPASS GRAFTING times 5 using left internal mammary artery, left radial artery, and right greater saphenous vein harvested by endovein (N/A) TRANSESOPHAGEAL ECHOCARDIOGRAM (TEE) (N/A) LEFT RADIAL ARTERY HARVEST (Left)  Patient location during evaluation: SICU Anesthesia Type: General Level of consciousness: sedated Pain management: pain level controlled Vital Signs Assessment: post-procedure vital signs reviewed and stable Respiratory status: patient remains intubated per anesthesia plan Cardiovascular status: stable Anesthetic complications: no               Reino KentJudd, Helaina Stefano J

## 2015-09-14 NOTE — Plan of Care (Signed)
Problem: Cardiac: Goal: Ability to maintain an adequate cardiac output will improve Outcome: Progressing On Nitroglycerin related to radial artery graft and small amount of phenylephrine. SR/ST

## 2015-09-15 ENCOUNTER — Inpatient Hospital Stay (HOSPITAL_COMMUNITY): Payer: BLUE CROSS/BLUE SHIELD

## 2015-09-15 ENCOUNTER — Encounter (HOSPITAL_COMMUNITY): Payer: Self-pay | Admitting: Cardiothoracic Surgery

## 2015-09-15 LAB — GLUCOSE, CAPILLARY
Glucose-Capillary: 121 mg/dL — ABNORMAL HIGH (ref 65–99)
Glucose-Capillary: 135 mg/dL — ABNORMAL HIGH (ref 65–99)
Glucose-Capillary: 108 mg/dL — ABNORMAL HIGH (ref 65–99)
Glucose-Capillary: 132 mg/dL — ABNORMAL HIGH (ref 65–99)
Glucose-Capillary: 123 mg/dL — ABNORMAL HIGH (ref 65–99)
Glucose-Capillary: 123 mg/dL — ABNORMAL HIGH (ref 65–99)

## 2015-09-15 LAB — CBC
HCT: 36.2 % — ABNORMAL LOW (ref 39.0–52.0)
HEMATOCRIT: 34.4 % — AB (ref 39.0–52.0)
HEMOGLOBIN: 11.3 g/dL — AB (ref 13.0–17.0)
Hemoglobin: 12.1 g/dL — ABNORMAL LOW (ref 13.0–17.0)
MCH: 29.3 pg (ref 26.0–34.0)
MCH: 29.2 pg (ref 26.0–34.0)
MCHC: 33.4 g/dL (ref 30.0–36.0)
MCHC: 32.8 g/dL (ref 30.0–36.0)
MCV: 87.7 fL (ref 78.0–100.0)
MCV: 88.9 fL (ref 78.0–100.0)
PLATELETS: 108 10*3/uL — AB (ref 150–400)
Platelets: 104 10*3/uL — ABNORMAL LOW (ref 150–400)
RBC: 4.13 MIL/uL — ABNORMAL LOW (ref 4.22–5.81)
RBC: 3.87 MIL/uL — AB (ref 4.22–5.81)
RDW: 13.5 % (ref 11.5–15.5)
RDW: 13.7 % (ref 11.5–15.5)
WBC: 13.9 10*3/uL — AB (ref 4.0–10.5)
WBC: 13.3 10*3/uL — ABNORMAL HIGH (ref 4.0–10.5)

## 2015-09-15 LAB — POCT I-STAT, CHEM 8
BUN: 14 mg/dL (ref 6–20)
BUN: 17 mg/dL (ref 6–20)
Calcium, Ion: 1.23 mmol/L (ref 1.12–1.23)
Calcium, Ion: 1.18 mmol/L (ref 1.12–1.23)
Chloride: 105 mmol/L (ref 101–111)
Chloride: 99 mmol/L — ABNORMAL LOW (ref 101–111)
Creatinine, Ser: 0.8 mg/dL (ref 0.61–1.24)
Creatinine, Ser: 1 mg/dL (ref 0.61–1.24)
Glucose, Bld: 116 mg/dL — ABNORMAL HIGH (ref 65–99)
Glucose, Bld: 111 mg/dL — ABNORMAL HIGH (ref 65–99)
HCT: 41 % (ref 39.0–52.0)
HCT: 37 % — ABNORMAL LOW (ref 39.0–52.0)
Hemoglobin: 13.9 g/dL (ref 13.0–17.0)
Hemoglobin: 12.6 g/dL — ABNORMAL LOW (ref 13.0–17.0)
Potassium: 4.3 mmol/L (ref 3.5–5.1)
Potassium: 3.9 mmol/L (ref 3.5–5.1)
Sodium: 141 mmol/L (ref 135–145)
Sodium: 138 mmol/L (ref 135–145)
TCO2: 23 mmol/L (ref 0–100)
TCO2: 25 mmol/L (ref 0–100)

## 2015-09-15 LAB — BASIC METABOLIC PANEL
ANION GAP: 6 (ref 5–15)
BUN: 14 mg/dL (ref 6–20)
CALCIUM: 8.3 mg/dL — AB (ref 8.9–10.3)
CO2: 24 mmol/L (ref 22–32)
CREATININE: 0.98 mg/dL (ref 0.61–1.24)
Chloride: 108 mmol/L (ref 101–111)
GLUCOSE: 126 mg/dL — AB (ref 65–99)
Potassium: 4.2 mmol/L (ref 3.5–5.1)
Sodium: 138 mmol/L (ref 135–145)

## 2015-09-15 LAB — MAGNESIUM
Magnesium: 2.4 mg/dL (ref 1.7–2.4)
Magnesium: 2.2 mg/dL (ref 1.7–2.4)

## 2015-09-15 LAB — CREATININE, SERUM
Creatinine, Ser: 1.08 mg/dL (ref 0.61–1.24)
GFR calc Af Amer: >60 mL/min (ref >60)
GFR calc non Af Amer: >60 mL/min (ref >60)

## 2015-09-15 LAB — HEMOGLOBIN A1C
Hgb A1c MFr Bld: 5.8 % — ABNORMAL HIGH (ref 4.8–5.6)
Mean Plasma Glucose: 120 mg/dL

## 2015-09-15 MED ORDER — INSULIN ASPART 100 UNIT/ML ~~LOC~~ SOLN
0.0000 [IU] | SUBCUTANEOUS | Status: DC
  Administered 2015-09-15 (×3): 2 [IU] via SUBCUTANEOUS

## 2015-09-15 MED ORDER — AMIODARONE HCL 200 MG PO TABS
400.0000 mg | ORAL_TABLET | Freq: Two times a day (BID) | ORAL | Status: DC
  Administered 2015-09-15 – 2015-09-18 (×6): 400 mg via ORAL
  Filled 2015-09-15 (×6): qty 2

## 2015-09-15 MED ORDER — POTASSIUM CHLORIDE CRYS ER 20 MEQ PO TBCR
40.0000 meq | EXTENDED_RELEASE_TABLET | Freq: Once | ORAL | Status: AC
  Administered 2015-09-15: 40 meq via ORAL
  Filled 2015-09-15: qty 2

## 2015-09-15 MED ORDER — FUROSEMIDE 10 MG/ML IJ SOLN
40.0000 mg | Freq: Once | INTRAMUSCULAR | Status: AC
  Administered 2015-09-15: 40 mg via INTRAVENOUS
  Filled 2015-09-15: qty 4

## 2015-09-15 NOTE — Op Note (Signed)
NAMJuliann Mule:  Reily, Selden            ACCOUNT NO.:  [id number]  MEDICAL RECORD NO.:  [id number]  LOCATION:  2S15C                        FACILITY:  [facility]  PHYSICIAN:  Kerin PernaPeter Van Trigt, M.D.  DATE OF BIRTH:  [birth year]  DATE OF PROCEDURE:  09/14/2015 DATE OF DISCHARGE:                              OPERATIVE REPORT   OPERATION: 1. Coronary artery bypass grafting x5 (left internal mammary artery to     LAD, left radial artery free graft to obtuse marginal 1, sequential     saphenous vein graft to posterior descending and posterolateral     branch of right coronary, saphenous vein graft to diagonal). 2. Endoscopic harvest of right leg greater saphenous vein.  SURGEON:  Kerin PernaPeter Van Trigt, M.D.  ASSISTANT:  Rowe ClackWayne E. Gold, P.A.-C.  PREOPERATIVE DIAGNOSES:  Unstable angina with severe 3-vessel coronary disease, 99% stenosis of the LAD.  POSTOPERATIVE DIAGNOSIS:  Unstable angina with severe 3-vessel coronary disease, 99% stenosis of the LAD.  ANESTHESIA:  General.  INDICATIONS:  The patient is a 55 year old, obese, nondiabetic, and nonsmoker presents with unstable angina.  [facility] Dr. Daryel NovemberGary Miller demonstrated severe multivessel coronary disease with a TIMI 2 flow through a 99% stenosis of the mid-to-distal LAD.  He was felt to be a candidate for surgical revascularization and was transferred to this hospital.  He was maintained on IV heparin.  He underwent his preoperative studies including carotid ultrasound which was negative, PFTs which were normal, chest x-ray which was normal, and hemoglobin A1c which was only minimally elevated.  I discussed the procedure of CABG for treatment of his severe coronary disease and reviewed the results of the cardiac catheterization with the patient and his family.  I discussed the details of surgery including the use of general anesthesia and cardiopulmonary bypass, the location of the surgical  incisions, and the expected postoperative hospital recovery.  I discussed with the patient risks to him of CABG including the potential complications of stroke, MI, bleeding requiring transfusion, postoperative lung problems including pleural effusion, infection, and death.  After reviewing these issues, he demonstrated his understanding and agreed to proceed with surgery under what I felt was an informed consent.  FINDINGS: 1. Adequate conduit. 2. Small left IMA, but with good flow. 3. Adequate targets, but difficult to expose due to the patient's     short obese body habitus.  OPERATIVE PROCEDURE:  The patient was brought to the operating room, placed supine on the operating table, general anesthesia was induced under invasive hemodynamic monitoring.  The chest, abdomen, and legs were prepped with Betadine and draped as a sterile field.  A proper time- out was performed.  An echocardiogram probe had been placed by the anesthesiologist.  A sternal incision was made as a left radial forearm incision was made to harvest the left radial artery as a free graft. The left IMA was grafted.  The right greater saphenous vein was harvested as an endoscopic technique.  A sternal retractor was placed using all deep blades because of the patient's short obese body habitus.  The pericardium was opened and suspended.  At this point, the left arm had been tucked to the patient's side  to avoid any positional complications from the left arm being out. Pursestrings were placed in ascending aorta and right atrium.  When the heparin was administered and after the vein had been harvested, the patient was cannulated and placed on cardiopulmonary bypass.  The coronaries were identified for grafting.  The mammary artery, radial artery, and vein were prepared for the distal anastomoses.  Cardioplegia cannulas were placed both antegrade and retrograde cold blood cardioplegia.  The patient was cooled to 32  degrees and an aortic crossclamp was applied.  1.5 L of cold blood cardioplegia was delivered in split doses between the aortic cardioplegia cannula and the coronary sinus retrograde cardioplegia catheter.  There was good cardioplegic arrest and septal temperature dropped less than 14 degrees. Cardioplegia was delivered every 20 minutes.  The distal coronary anastomoses were performed.  The first distal anastomosis was the sequential vein graft to the PD and PL.  The PD was 1.5 mm vessel with proximal 80% stenosis.  A side-to-side anastomosis with the vein was constructed using 7-0 Prolene.  There was good flow through the graft.  The second distal anastomosis was the continuation of the sequential vein graft to the posterolateral.  This was 1.5 mm vessel with proximal 90% stenosis.  The end of vein was sewn end-to-side with running 7-0 Prolene.  There was good flow through the graft.  Cardioplegia was redosed.  The third distal anastomosis was to the OM branch of the circumflex. This had a proximal 80%-90% stenosis.  The left radial free graft was then sewn end-to-side with running 8-0 Prolene, and there was good flow through the graft.  Cardioplegia was redosed.  The fourth distal anastomosis was to the first diagonal branch to the LAD.  This an ostial 80-90% stenosis.  A reverse saphenous vein was sewn end-to-side with running 7-0 Prolene with good flow through the graft. Cardioplegia was redosed.  The 5th distal anastomosis was the distal third of the LAD.  There was heavy calcification more proximally with a 90% stenosis.  The left IMA pedicle was brought through an opening and the left lateral pericardium was brought down onto the LAD and sewn end-to-side with running 8-0 Prolene.  There was good flow through the anastomosis after briefly releasing the pedicle and bulldog on the mammary artery.  The bulldog was reapplied and the pedicle was secured to the epicardium with  6-0 Prolenes.  Cardioplegia was redosed.  While the crossclamp was still in place, 2 proximal vein anastomoses were performed on the ascending aorta using a 4.5 mm punch running 6-0 Prolene.  The proximal anastomosis of the free radial artery graft was sewn to the hood of the vein graft to the circumflex.  Prior to tying down the final proximal anastomosis, air was vented from the coronaries with a dose of retrograde warm blood cardioplegia.  The crossclamp was removed.  The vein grafts were de-aired and opened.  Each had good flow and hemostasis was documented at the proximal and distal sites.  The patient was rewarmed and reperfused.  The cardioplegia cannula was removed.  The lungs were expanded and ventilator was resumed.  The patient was then weaned from cardiopulmonary bypass without needing inotropes.  Cardiac output and blood pressure were normal.  Echo showed preserved global LV function.  Protamine was administered without adverse reaction.  The cannulas were removed.  The mediastinum was irrigated with warm saline. The superior pericardial fat was closed over the aorta.  Anterior mediastinal and left pleural chest tubes were  placed and brought out through separate incisions.  The sternum was closed with interrupted steel wire.  The pectoralis fascia was closed using running #1 Vicryl.  The subcutaneous and skin layers were closed in running Vicryl.  The patient remained stable. Total cardiopulmonary bypass time was 128 minutes.     Kerin Perna, M.D.     PV/MEDQ  D:  09/15/2015  T:  09/15/2015  Job:  [id number]  cc:   Daryel November, MD

## 2015-09-15 NOTE — Progress Notes (Signed)
1 Day Post-Op Procedure(s) (LRB): CORONARY ARTERY BYPASS GRAFTING times 5 using left internal mammary artery, left radial artery, and right greater saphenous vein harvested by endovein (N/A) TRANSESOPHAGEAL ECHOCARDIOGRAM (TEE) (N/A) LEFT RADIAL ARTERY HARVEST (Left) Subjective: CABG with L RA Not diabetic  Objective: Vital signs in last 24 hours: Temp:  [98.1 F (36.7 C)-100 F (37.8 C)] 99 F (37.2 C) (05/02 0715) Pulse Rate:  [69-107] 70 (05/02 0715) Cardiac Rhythm:  [-] Normal sinus rhythm (05/02 0700) Resp:  [11-32] 19 (05/02 0715) BP: (84-126)/(49-69) 98/57 mmHg (05/02 0715) SpO2:  [88 %-100 %] 99 % (05/02 0715) Arterial Line BP: (68-175)/(46-73) 124/51 mmHg (05/02 0715) FiO2 (%):  [50 %-70 %] 70 % (05/01 1530) Weight:  [267 lb 6.7 oz (121.3 kg)-276 lb 7.3 oz (125.4 kg)] 276 lb 7.3 oz (125.4 kg) (05/02 0444)  Hemodynamic parameters for last 24 hours: PAP: (17-42)/(9-23) 25/14 mmHg CO:  [5.1 L/min-7.9 L/min] 6 L/min CI:  [2.1 L/min/m2-3.4 L/min/m2] 2.6 L/min/m2  Intake/Output from previous day: 05/01 0701 - 05/02 0700 In: 6517.2 [I.V.:3572.2; Blood:1035; NG/GT:30; IV Piggyback:1880] Out: 6420 [Urine:4195; Blood:1635; Chest Tube:590] Intake/Output this shift:         Exam    General- alert and comfortable   Lungs- clear without rales, wheezes   Cor- regular rate and rhythm, no murmur , gallop   Abdomen- soft, non-tender   Extremities - warm, non-tender, minimal edema   Neuro- oriented, appropriate, no focal weakness   Lab Results:  Recent Labs  09/14/15 2130 09/14/15 2140 09/15/15 0400  WBC 13.7*  --  13.9*  HGB 13.4 13.9 12.1*  HCT 39.9 41.0 36.2*  PLT 114*  --  108*   BMET:  Recent Labs  09/14/15 0532  09/14/15 2140 09/15/15 0400  NA 138  < > 141 138  K 4.2  < > 4.3 4.2  CL 102  < > 105 108  CO2 23  --   --  24  GLUCOSE 112*  < > 116* 126*  BUN 14  < > 14 14  CREATININE 0.99  < > 0.80 0.98  CALCIUM 9.5  --   --  8.3*  < > = values in this  interval not displayed.  PT/INR:  Recent Labs  09/14/15 1530  LABPROT 17.8*  INR 1.46   ABG    Component Value Date/Time   PHART 7.332* 09/14/2015 2023   HCO3 23.9 09/14/2015 2023   TCO2 23 09/14/2015 2140   ACIDBASEDEF 2.0 09/14/2015 2023   O2SAT 96.0 09/14/2015 2023   CBG (last 3)   Recent Labs  09/14/15 2023 09/14/15 2355 09/15/15 0354  GLUCAP 118* 121* 135*    Assessment/Plan: S/P Procedure(s) (LRB): CORONARY ARTERY BYPASS GRAFTING times 5 using left internal mammary artery, left radial artery, and right greater saphenous vein harvested by endovein (N/A) TRANSESOPHAGEAL ECHOCARDIOGRAM (TEE) (N/A) LEFT RADIAL ARTERY HARVEST (Left) Mobilize Diuresis Diabetes control d/c tubes/lines See progression orders   LOS: 5 days    Billy Waters 09/15/2015

## 2015-09-15 NOTE — Progress Notes (Signed)
Patient ID: Billy Waters, male   DOB: 1960/09/26, 55 y.o.   MRN: [id number]  SICU Evening Rounds:   Hemodynamically stable off neo now  Ambulated around the ICU.  Urine output good  CT output low  CBC    Component Value Date/Time   WBC 13.3* 09/15/2015 1700   RBC 3.87* 09/15/2015 1700   HGB 12.6* 09/15/2015 1720   HCT 37.0* 09/15/2015 1720   PLT 104* 09/15/2015 1700   MCV 88.9 09/15/2015 1700   MCH 29.2 09/15/2015 1700   MCHC 32.8 09/15/2015 1700   RDW 13.7 09/15/2015 1700     BMET    Component Value Date/Time   NA 138 09/15/2015 1720   K 3.9 09/15/2015 1720   CL 99* 09/15/2015 1720   CO2 24 09/15/2015 0400   GLUCOSE 111* 09/15/2015 1720   BUN 17 09/15/2015 1720   CREATININE 1.00 09/15/2015 1720   CALCIUM 8.3* 09/15/2015 0400   GFRNONAA >60 09/15/2015 1700   GFRAA >60 09/15/2015 1700     A/P:  Stable postop course. Continue current plans

## 2015-09-16 ENCOUNTER — Inpatient Hospital Stay (HOSPITAL_COMMUNITY): Payer: BLUE CROSS/BLUE SHIELD

## 2015-09-16 LAB — CBC
HCT: 32.6 % — ABNORMAL LOW (ref 39.0–52.0)
Hemoglobin: 10.8 g/dL — ABNORMAL LOW (ref 13.0–17.0)
MCH: 30.1 pg (ref 26.0–34.0)
MCHC: 33.1 g/dL (ref 30.0–36.0)
MCV: 90.8 fL (ref 78.0–100.0)
Platelets: 89 10*3/uL — ABNORMAL LOW (ref 150–400)
RBC: 3.59 MIL/uL — ABNORMAL LOW (ref 4.22–5.81)
RDW: 13.8 % (ref 11.5–15.5)
WBC: 11.4 10*3/uL — ABNORMAL HIGH (ref 4.0–10.5)

## 2015-09-16 LAB — BASIC METABOLIC PANEL
Anion gap: 8 (ref 5–15)
BUN: 19 mg/dL (ref 6–20)
CO2: 25 mmol/L (ref 22–32)
Calcium: 8 mg/dL — ABNORMAL LOW (ref 8.9–10.3)
Chloride: 102 mmol/L (ref 101–111)
Creatinine, Ser: 1.09 mg/dL (ref 0.61–1.24)
GFR calc Af Amer: >60 mL/min (ref >60)
GFR calc non Af Amer: >60 mL/min (ref >60)
Glucose, Bld: 110 mg/dL — ABNORMAL HIGH (ref 65–99)
Potassium: 4.1 mmol/L (ref 3.5–5.1)
Sodium: 135 mmol/L (ref 135–145)

## 2015-09-16 LAB — GLUCOSE, CAPILLARY
Glucose-Capillary: 110 mg/dL — ABNORMAL HIGH (ref 65–99)
Glucose-Capillary: 112 mg/dL — ABNORMAL HIGH (ref 65–99)
Glucose-Capillary: 112 mg/dL — ABNORMAL HIGH (ref 65–99)
Glucose-Capillary: 114 mg/dL — ABNORMAL HIGH (ref 65–99)

## 2015-09-16 MED ORDER — SODIUM CHLORIDE 0.9% FLUSH: 3.0000 mL | INTRAVENOUS | Status: DC | PRN

## 2015-09-16 MED ORDER — POTASSIUM CHLORIDE CRYS ER 20 MEQ PO TBCR
20.0000 meq | EXTENDED_RELEASE_TABLET | Freq: Every day | ORAL | Status: DC
  Administered 2015-09-16 – 2015-09-18 (×3): 20 meq via ORAL
  Filled 2015-09-16 (×3): qty 1

## 2015-09-16 MED ORDER — MOVING RIGHT ALONG BOOK
Freq: Once | Status: AC
  Administered 2015-09-16: 1
  Filled 2015-09-16: qty 1

## 2015-09-16 MED ORDER — ZOLPIDEM TARTRATE 5 MG PO TABS
5.0000 mg | ORAL_TABLET | Freq: Every evening | ORAL | Status: DC | PRN
  Administered 2015-09-17: 5 mg via ORAL
  Filled 2015-09-16: qty 1

## 2015-09-16 MED ORDER — MAGNESIUM HYDROXIDE 400 MG/5ML PO SUSP: 30.0000 mL | Freq: Every day | ORAL | Status: DC | PRN

## 2015-09-16 MED ORDER — FUROSEMIDE 10 MG/ML IJ SOLN
40.0000 mg | Freq: Once | INTRAMUSCULAR | Status: AC
  Administered 2015-09-16: 40 mg via INTRAVENOUS
  Filled 2015-09-16: qty 4

## 2015-09-16 MED ORDER — SODIUM CHLORIDE 0.9 % IV SOLN: 250.0000 mL | INTRAVENOUS | Status: DC | PRN

## 2015-09-16 MED ORDER — SODIUM CHLORIDE 0.9% FLUSH
3.0000 mL | Freq: Two times a day (BID) | INTRAVENOUS | Status: DC
  Administered 2015-09-16 – 2015-09-17 (×3): 3 mL via INTRAVENOUS

## 2015-09-16 MED ORDER — FUROSEMIDE 40 MG PO TABS
40.0000 mg | ORAL_TABLET | Freq: Every day | ORAL | Status: DC
  Administered 2015-09-17 – 2015-09-18 (×2): 40 mg via ORAL
  Filled 2015-09-16 (×2): qty 1

## 2015-09-16 MED ORDER — ATORVASTATIN CALCIUM 80 MG PO TABS
80.0000 mg | ORAL_TABLET | Freq: Every day | ORAL | Status: DC
  Administered 2015-09-16 – 2015-09-17 (×2): 80 mg via ORAL
  Filled 2015-09-16 (×2): qty 1

## 2015-09-16 NOTE — Progress Notes (Signed)
Pt transferred to [facility] via ambulation, vss, wife at side, all patient belonging sent with patient, receiving RN at bedside. Darrel HooverWilson,Damario Gillie S 2:11 PM

## 2015-09-16 NOTE — Progress Notes (Signed)
2 Days Post-Op Procedure(s) (LRB): CORONARY ARTERY BYPASS GRAFTING times 5 using left internal mammary artery, left radial artery, and right greater saphenous vein harvested by endovein (N/A) TRANSESOPHAGEAL ECHOCARDIOGRAM (TEE) (N/A) LEFT RADIAL ARTERY HARVEST (Left) Subjective: Progressing after CABG  Off neo Walking in hall  Objective: Vital signs in last 24 hours: Temp:  [98 F (36.7 C)-99 F (37.2 C)] 98.3 F (36.8 C) (05/03 0700) Pulse Rate:  [61-78] 66 (05/03 0800) Cardiac Rhythm:  [-] Normal sinus rhythm (05/03 0800) Resp:  [0-26] 0 (05/03 0800) BP: (87-128)/(51-109) 124/65 mmHg (05/03 0800) SpO2:  [91 %-100 %] 96 % (05/03 0800) Weight:  [279 lb 8.7 oz (126.8 kg)] 279 lb 8.7 oz (126.8 kg) (05/03 0500)  Hemodynamic parameters for last 24 hours: PAP: (25-28)/(14-16) 25/14 mmHg  Intake/Output from previous day: 05/02 0701 - 05/03 0700 In: 1894.8 [P.O.:1030; I.V.:814.8; IV Piggyback:50] Out: 1740 [Urine:1280; Chest Tube:460] Intake/Output this shift: Total I/O In: 10 [I.V.:10] Out: 40 [Urine:30; Chest Tube:10]  General appearance: alert and cooperative  nsr Min chest tube drainage  Lab Results:  Recent Labs  09/15/15 1700 09/15/15 1720 09/16/15 0430  WBC 13.3*  --  11.4*  HGB 11.3* 12.6* 10.8*  HCT 34.4* 37.0* 32.6*  PLT 104*  --  89*   BMET:  Recent Labs  09/15/15 0400  09/15/15 1720 09/16/15 0430  NA 138  --  138 135  K 4.2  --  3.9 4.1  CL 108  --  99* 102  CO2 24  --   --  25  GLUCOSE 126*  --  111* 110*  BUN 14  --  17 19  CREATININE 0.98  < > 1.00 1.09  CALCIUM 8.3*  --   --  8.0*  < > = values in this interval not displayed.  PT/INR:  Recent Labs  09/14/15 1530  LABPROT 17.8*  INR 1.46   ABG    Component Value Date/Time   PHART 7.332* 09/14/2015 2023   HCO3 23.9 09/14/2015 2023   TCO2 25 09/15/2015 1720   ACIDBASEDEF 2.0 09/14/2015 2023   O2SAT 96.0 09/14/2015 2023   CBG (last 3)   Recent Labs  09/15/15 1947  09/16/15 0013 09/16/15 0425  GLUCAP 123* 112* 112*    Assessment/Plan: S/P Procedure(s) (LRB): CORONARY ARTERY BYPASS GRAFTING times 5 using left internal mammary artery, left radial artery, and right greater saphenous vein harvested by endovein (N/A) TRANSESOPHAGEAL ECHOCARDIOGRAM (TEE) (N/A) LEFT RADIAL ARTERY HARVEST (Left) Mobilize Diuresis Plan for transfer to step-down: see transfer orders   LOS: 6 days    Kathlee Nationseter Van Trigt III 09/16/2015

## 2015-09-16 NOTE — Progress Notes (Signed)
[facility] Pt and wife oriented to room and equipment. Pt denies pain, CCMD notified.   Leonidas Rombergaitlin S Bumbledare, RN

## 2015-09-16 NOTE — Progress Notes (Addendum)
[facility], unable to take report at this time, a/w callback. Louie BunWilson,Nakenya Theall S 12:57 PM   Report given to RN on [facility]. Chrsitopher Wik S 1:37 PM

## 2015-09-17 ENCOUNTER — Inpatient Hospital Stay (HOSPITAL_COMMUNITY): Payer: BLUE CROSS/BLUE SHIELD

## 2015-09-17 LAB — TYPE AND SCREEN
ABO/RH(D): A POS
Antibody Screen: NEGATIVE
Unit division: 0
Unit division: 0

## 2015-09-17 LAB — BASIC METABOLIC PANEL
Anion gap: 10 (ref 5–15)
BUN: 24 mg/dL — ABNORMAL HIGH (ref 6–20)
CO2: 25 mmol/L (ref 22–32)
Calcium: 8.3 mg/dL — ABNORMAL LOW (ref 8.9–10.3)
Chloride: 101 mmol/L (ref 101–111)
Creatinine, Ser: 1.07 mg/dL (ref 0.61–1.24)
GFR calc Af Amer: >60 mL/min (ref >60)
GFR calc non Af Amer: >60 mL/min (ref >60)
Glucose, Bld: 108 mg/dL — ABNORMAL HIGH (ref 65–99)
Potassium: 4.4 mmol/L (ref 3.5–5.1)
Sodium: 136 mmol/L (ref 135–145)

## 2015-09-17 LAB — CBC
HCT: 33 % — ABNORMAL LOW (ref 39.0–52.0)
Hemoglobin: 10.6 g/dL — ABNORMAL LOW (ref 13.0–17.0)
MCH: 28.9 pg (ref 26.0–34.0)
MCHC: 32.1 g/dL (ref 30.0–36.0)
MCV: 89.9 fL (ref 78.0–100.0)
Platelets: 117 10*3/uL — ABNORMAL LOW (ref 150–400)
RBC: 3.67 MIL/uL — ABNORMAL LOW (ref 4.22–5.81)
RDW: 13.7 % (ref 11.5–15.5)
WBC: 10.8 10*3/uL — ABNORMAL HIGH (ref 4.0–10.5)

## 2015-09-17 MED ORDER — LISINOPRIL 10 MG PO TABS
10.0000 mg | ORAL_TABLET | Freq: Every day | ORAL | Status: DC
  Administered 2015-09-17 – 2015-09-18 (×2): 10 mg via ORAL
  Filled 2015-09-17 (×2): qty 1

## 2015-09-17 NOTE — Discharge Summary (Signed)
Physician Discharge Summary       [address] [place]       [place] [address]             [phone]    Patient ID: Billy Waters MRN: [id number] DOB/AGE: 24-May-1960 55 y.o.  Admit date: 09/10/2015 Discharge date: 09/18/2015  Admission Diagnoses: Multivessel CAD  Active Diagnoses:  1. Hypertension 2. Hyperlipidemia 3. ABL anemia 4. Thrombocytopenia  Procedure (s):  1. Coronary artery bypass grafting x5 (left internal mammary artery to LAD, left radial artery free graft to obtuse marginal 1, sequential saphenous vein graft to posterior descending and posterolateral branch of right coronary, saphenous vein graft to diagonal).  2. Endoscopic harvest of right leg greater saphenous vein by Dr. Donata Clay on 09/15/2015.  History of Presenting Illness: Billy Waters is a 55 yo white male with known history of HTN, Hyperlipidemia, and chronic back pain. 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 He noticed recently that after leaving softball practice while walking up the hill to his car he developed chest tightness. He states this continued any time he was walking up an incline. His wife works for a [employer] and got him an appointment to be seen. EKG was obtained and showed changes that were concerning to his PCP. He referred the patient to follow up with his Primary cardiology who after review of the EKG felt the patient should undergo stress testing. This was originally scheduled for today, but the patient's chest tightness worsened. He again presented for evaluation with his Cardiologist who performed his stress test sooner. He lasted about 4 min prior to developing chest tightness. He subsequently was scheduled for cardiac catheterization which showed multivessel CAD and a preserved EF. It was felt coronary bypass grafting would be indicated and he was subsequently transferred to [facility] He denies  family history of heart disease. He denies nicotine abuse. The patient was chest pain free at the time of being seen. He was on a Heparin drip. Dr. Donata Clay discussed the need for coronary artery bypass grafting surgery. Potential risks, benefits, and complications of the surgery were discussed with the patient and he agreed to proceed with surgery. Pre operative carotid duplex US showed no significant internal carotid artery stenosis bilaterally. He underwent a CABG x 5 on 09/15/2015.  Brief Hospital Course:  The patient was extubated the evening of surgery without difficulty. He remained afebrile and hemodynamically stable. He was weaned off of Neo synephrine drip. Theone Murdoch, a line, chest tubes, and foley were removed early in the post operative course. Lopressor was started and titrated accordingly. He was volume over loaded and diuresed. He had ABL anemia. He did not require a post op transfusion. His last H and H was 10.6 and 33. He also had thrombocytopenia. His last platelet count was up to 117,000. He was started on Imdur for his left radial artery graft. He was weaned off the insulin drip.The patient's HGA1C pre op was 5.8. 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 The patient was felt surgically stable for transfer from the ICU to [facility] for further convalescence on 09/16/2015. He continues to progress with cardiac rehab. He was ambulating on room air. He has been tolerating a diet and has had a bowel movement. Epicardial pacing wires and chest tube sutures will be removed prior to discharge. The patient is felt surgically stable for discharge today.   Latest Vital Signs: Blood pressure  104/57, pulse 67, temperature 98.7 F (37.1 C), temperature source Oral, resp. rate 18, height 5\' 10"  (1.778 m), weight 275 lb 3.2 oz (124.83 kg), SpO2 95 %.  Physical Exam: General appearance: alert, cooperative and no distress Heart: regular rate and rhythm Lungs: dim in the bases Abdomen: benign Extremities: +  edema, left hand N/V intact Wound: incis healinh well  Discharge Condition:Stable and discharged to home  Recent laboratory studies:  Lab Results  Component Value Date   WBC 10.8* 09/17/2015   HGB 10.6* 09/17/2015   HCT 33.0* 09/17/2015   MCV 89.9 09/17/2015   PLT 117* 09/17/2015   Lab Results  Component Value Date   NA 136 09/17/2015   K 4.4 09/17/2015   CL 101 09/17/2015   CO2 25 09/17/2015   CREATININE 1.07 09/17/2015   GLUCOSE 108* 09/17/2015     Diagnostic Studies: Dg Chest 2 View  09/17/2015  CLINICAL DATA:  Status post CABG 4 days ago EXAM: CHEST  2 VIEW COMPARISON:  Portable chest x-ray of Sep 16, 2015 at 5:46 a.m. FINDINGS: The lungs are better inflated today. There is minimal subsegmental atelectasis at the left lung base with a small left pleural effusion. A trace of pleural fluid on the right is suspected as well. There is no pneumothorax. The cardiac silhouette remains enlarged but its margins are more distinct. The pulmonary vascularity is less engorged. The sternal wires are intact. IMPRESSION: Improved aeration and decreased interstitial edema bilaterally. No pneumothorax. Persistent small left pleural effusion with basilar atelectasis. Trace right pleural effusion. Electronically Signed   By: David  [place] M.D.   On: 09/17/2015 07:48   Discharge Medications:   Medication List    STOP taking these medications        carvedilol 6.25 MG tablet  Commonly known as:  COREG     etodolac 400 MG tablet  Commonly known as:  LODINE     nitroGLYCERIN 0.4 MG SL tablet  Commonly known as:  NITROSTAT      TAKE these medications        amiodarone 200 MG tablet  Commonly known as:  PACERONE  Take 1 tablet (200 mg total) by mouth 2 (two) times daily.     aspirin 325 MG EC tablet  Take 1 tablet (325 mg total) by mouth daily.     atorvastatin 80 MG tablet  Commonly known as:  LIPITOR  Take 1 tablet (80 mg total) by mouth daily at 6 PM.     Fish Oil 1000 MG Caps    Take 1,000 mg by mouth daily.     glucosamine-chondroitin 500-400 MG tablet  Take 1 tablet by mouth daily.     isosorbide mononitrate 30 MG 24 hr tablet  Commonly known as:  IMDUR  Take 0.5 tablets (15 mg total) by mouth daily.     lisinopril-hydrochlorothiazide 10-12.5 MG tablet  Commonly known as:  PRINZIDE,ZESTORETIC  Take 1 tablet by mouth daily.     metoprolol tartrate 25 MG tablet  Commonly known as:  LOPRESSOR  Take 1 tablet (25 mg total) by mouth 2 (two) times daily.     multivitamin with minerals Tabs tablet  Take 1 tablet by mouth daily.     traMADol 50 MG tablet  Commonly known as:  ULTRAM  Take 1-2 tablets (50-100 mg total) by mouth every 6 (six) hours as needed for moderate pain.     vitamin C 1000 MG tablet  Take 1,000 mg by mouth daily.  vitamin E 400 UNIT capsule  Take 400 Units by mouth daily.       The patient has been discharged on:   1.Beta Blocker:  Yes [ x  ]                              No   [   ]                              If No, reason:  2.Ace Inhibitor/ARB: Yes [  x ]                                     No  [    ]                                     If No, reason:  3.Statin:   Yes [  x ]                  No  [   ]                  If No, reason:  4.Ecasa:  Yes  [  x ]                  No   [   ]                  If No, reason:  Follow Up Appointments: Follow-up Information    Follow up with Kerin PernaPeter Van Trigt III, MD On 10/14/2015.   Specialty:  Cardiothoracic Surgery   Why:  PA/LAT CXR to be taken at [facility] which is in the same building as Dr. Zenaida NieceVan Trigt's office) on 10/14/2015 at 12:45 pm;Appointment time is at 1:30 pm   Contact information:   [address] [place] [address] [phone]       Follow up with Freddy FinnerGary P.Miller.   Why:  Call for a follow up appointment for 2 weeks   Contact information:   [address] [place], [place] [id number]      Follow up with Eldridge AbrahamsPOMPOSINI,DANIEL L, MD.    Specialty:  Internal Medicine   Why:  Call for a follow up appointment regrding further surveillance of HGA1C 5.8 (pre diabetes)      Follow up with [facility].   Specialty:  Cardiothoracic Surgery   Why:  suture and staple removal 09/25/2015 at 11:00 am   Contact information:   [address] [place] [id number] [phone]      Signed: Rogers BlockerGOLD,Alanis Clift EPA-C 09/18/2015, 3:46 PM

## 2015-09-17 NOTE — Progress Notes (Signed)
[address]       [place] [id number]             [phone]      3 Days Post-Op Procedure(s) (LRB): CORONARY ARTERY BYPASS GRAFTING times 5 using left internal mammary artery, left radial artery, and right greater saphenous vein harvested by endovein (N/A) TRANSESOPHAGEAL ECHOCARDIOGRAM (TEE) (N/A) LEFT RADIAL ARTERY HARVEST (Left) Subjective: Feels pretty well overall  Objective: Vital signs in last 24 hours: Temp:  [98 F (36.7 C)-98.7 F (37.1 C)] 98.7 F (37.1 C) (05/04 0656) Pulse Rate:  [62-70] 70 (05/03 2037) Cardiac Rhythm:  [-] Normal sinus rhythm (05/03 1934) Resp:  [8-21] 18 (05/04 0656) BP: (90-146)/(50-75) 138/73 mmHg (05/04 0656) SpO2:  [91 %-99 %] 96 % (05/04 0656) Weight:  [276 lb 6.4 oz (125.374 kg)] 276 lb 6.4 oz (125.374 kg) (05/04 0656)  Hemodynamic parameters for last 24 hours:    Intake/Output from previous day: 05/03 0701 - 05/04 0700 In: 300 [P.O.:240; I.V.:10; IV Piggyback:50] Out: 785 [Urine:705; Chest Tube:80] Intake/Output this shift:    General appearance: alert, cooperative and no distress Heart: regular rate and rhythm Lungs: dim in the bases Abdomen: benign Extremities: + edema, left hand N/V intact Wound: incis healinh well  Lab Results:  Recent Labs  09/16/15 0430 09/17/15 0354  WBC 11.4* 10.8*  HGB 10.8* 10.6*  HCT 32.6* 33.0*  PLT 89* 117*   BMET:  Recent Labs  09/16/15 0430 09/17/15 0354  NA 135 136  K 4.1 4.4  CL 102 101  CO2 25 25  GLUCOSE 110* 108*  BUN 19 24*  CREATININE 1.09 1.07  CALCIUM 8.0* 8.3*    PT/INR:  Recent Labs  09/14/15 1530  LABPROT 17.8*  INR 1.46   ABG    Component Value Date/Time   PHART 7.332* 09/14/2015 2023   HCO3 23.9 09/14/2015 2023   TCO2 25 09/15/2015 1720   ACIDBASEDEF 2.0 09/14/2015 2023   O2SAT 96.0 09/14/2015 2023   CBG (last 3)   Recent Labs  09/16/15 0425 09/16/15 0753 09/16/15 1645  GLUCAP 112* 110* 114*    Meds Scheduled  Meds: . acetaminophen  1,000 mg Oral Q6H   Or  . acetaminophen (TYLENOL) oral liquid 160 mg/5 mL  1,000 mg Per Tube Q6H  . amiodarone  400 mg Oral BID  . aspirin EC  325 mg Oral Daily   Or  . aspirin  324 mg Per Tube Daily  . atorvastatin  80 mg Oral q1800  . bisacodyl  10 mg Oral Daily   Or  . bisacodyl  10 mg Rectal Daily  . docusate sodium  200 mg Oral Daily  . furosemide  40 mg Oral Daily  . isosorbide mononitrate  15 mg Oral Daily  . metoprolol tartrate  12.5 mg Oral BID   Or  . metoprolol tartrate  12.5 mg Per Tube BID  . pantoprazole  40 mg Oral Daily  . potassium chloride  20 mEq Oral Daily  . sodium chloride flush  3 mL Intravenous Q12H  . sodium chloride flush  3 mL Intravenous Q12H   Continuous Infusions: . sodium chloride Stopped (09/15/15 1500)  . sodium chloride    . sodium chloride Stopped (09/15/15 1500)   PRN Meds:.sodium chloride, sodium chloride, magnesium hydroxide, metoprolol, ondansetron (ZOFRAN) IV, oxyCODONE, sodium chloride flush, sodium chloride flush, traMADol, zolpidem  Xrays Dg Chest 2 View  09/17/2015  CLINICAL DATA:  Status post CABG 4 days  ago EXAM: CHEST  2 VIEW COMPARISON:  Portable chest x-ray of Sep 16, 2015 at 5:46 a.m. FINDINGS: The lungs are better inflated today. There is minimal subsegmental atelectasis at the left lung base with a small left pleural effusion. A trace of pleural fluid on the right is suspected as well. There is no pneumothorax. The cardiac silhouette remains enlarged but its margins are more distinct. The pulmonary vascularity is less engorged. The sternal wires are intact. IMPRESSION: Improved aeration and decreased interstitial edema bilaterally. No pneumothorax. Persistent small left pleural effusion with basilar atelectasis. Trace right pleural effusion. Electronically Signed   By: David  Swaziland M.D.   On: 09/17/2015 07:48   Dg Chest Port 1 View  09/16/2015  CLINICAL DATA:  Status post coronary artery bypass grafting. Chest  tube in place EXAM: PORTABLE CHEST 1 VIEW COMPARISON:  Sep 15, 2015 FINDINGS: The Swan-Ganz catheter has been removed. Cordis tip is in the superior vena cava. There is a chest tube on the left. There is a mediastinal drain. No pneumothorax. There is mild atelectasis in the bases, slightly more on the left than on the right. Lungs elsewhere clear. Heart is mildly enlarged with pulmonary vascularity within normal limits. No adenopathy. IMPRESSION: Tube and catheter positions as described without pneumothorax. Mild bibasilar atelectasis. Stable cardiac prominence. Electronically Signed   By: Bretta Bang III M.D.   On: 09/16/2015 07:39    Assessment/Plan: S/P Procedure(s) (LRB): CORONARY ARTERY BYPASS GRAFTING times 5 using left internal mammary artery, left radial artery, and right greater saphenous vein harvested by endovein (N/A) TRANSESOPHAGEAL ECHOCARDIOGRAM (TEE) (N/A) LEFT RADIAL ARTERY HARVEST (Left)   1 good overall progress 2 cont diuresis for volume overload 3 hemodyn stable in sinus rhythm- BP should tolerate initiation of low dose ACEI, he is on amio 4 Imdur for radial graft 5 sugars well controlled 6 routine pulm toilet rehab 7 ABL anemia is stable  LOS: 7 days    August Gosser E 09/17/2015

## 2015-09-18 MED ORDER — METOPROLOL TARTRATE 25 MG/10 ML ORAL SUSPENSION: 12.5000 mg | Freq: Two times a day (BID) | ORAL | Status: DC

## 2015-09-18 MED ORDER — METOPROLOL TARTRATE 25 MG PO TABS
25.0000 mg | ORAL_TABLET | Freq: Two times a day (BID) | ORAL | Status: DC
  Administered 2015-09-18: 25 mg via ORAL
  Filled 2015-09-18: qty 1

## 2015-09-18 MED ORDER — ISOSORBIDE MONONITRATE ER 30 MG PO TB24: 15.0000 mg | ORAL_TABLET | Freq: Every day | ORAL | Status: DC

## 2015-09-18 MED ORDER — METOPROLOL TARTRATE 25 MG PO TABS: 25.0000 mg | ORAL_TABLET | Freq: Two times a day (BID) | ORAL | Status: DC

## 2015-09-18 MED ORDER — ATORVASTATIN CALCIUM 80 MG PO TABS: 80.0000 mg | ORAL_TABLET | Freq: Every day | ORAL | Status: DC

## 2015-09-18 MED ORDER — TRAMADOL HCL 50 MG PO TABS: 50.0000 mg | ORAL_TABLET | Freq: Four times a day (QID) | ORAL | Status: DC | PRN

## 2015-09-18 MED ORDER — ASPIRIN 325 MG PO TBEC: 325.0000 mg | DELAYED_RELEASE_TABLET | Freq: Every day | ORAL | Status: DC

## 2015-09-18 MED ORDER — AMIODARONE HCL 200 MG PO TABS: 200.0000 mg | ORAL_TABLET | Freq: Two times a day (BID) | ORAL | Status: DC

## 2015-09-18 NOTE — Discharge Instructions (Signed)
Coronary Artery Bypass Grafting, Care After °Refer to this sheet in the next few weeks. These instructions provide you with information on caring for yourself after your procedure. Your health care provider may also give you more specific instructions. Your treatment has been planned according to current medical practices, but problems sometimes occur. Call your health care provider if you have any problems or questions after your procedure. °WHAT TO EXPECT AFTER THE PROCEDURE °Recovery from surgery will be different for everyone. Some people feel well after 3 or 4 weeks, while for others it takes longer. After your procedure, it is typical to have the following: °· Nausea and a lack of appetite.   °· Constipation. °· Weakness and fatigue.   °· Depression or irritability.   °· Pain or discomfort at your incision site. °HOME CARE INSTRUCTIONS °· Take medicines only as directed by your health care provider. Do not stop taking medicines or start any new medicines without first checking with your health care provider. °· Take your pulse as directed by your health care provider. °· Perform deep breathing as directed by your health care provider. If you were given a device called an incentive spirometer, use it to practice deep breathing several times a day. Support your chest with a pillow or your arms when you take deep breaths or cough. °· Keep incision areas clean, dry, and protected. Remove or change any bandages (dressings) only as directed by your health care provider. You may have skin adhesive strips over the incision areas. Do not take the strips off. They will fall off on their own. °· Check incision areas daily for any swelling, redness, or drainage. °· If incisions were made in your legs, do the following: °¨ Avoid crossing your legs.   °¨ Avoid sitting for long periods of time. Change positions every 30 minutes.   °¨ Elevate your legs when you are sitting. °· Wear compression stockings as directed by your  health care provider. These stockings help keep blood clots from forming in your legs. °· Take showers once your health care provider approves. Until then, only take sponge baths. Pat incisions dry. Do not rub incisions with a washcloth or towel. Do not take baths, swim, or use a hot tub until your health care provider approves. °· Eat foods that are high in fiber, such as raw fruits and vegetables, whole grains, beans, and nuts. Meats should be lean cut. Avoid canned, processed, and fried foods. °· Drink enough fluid to keep your urine clear or pale yellow. °· Weigh yourself every day. This helps identify if you are retaining fluid that may make your heart and lungs work harder. °· Rest and limit activity as directed by your health care provider. You may be instructed to: °¨ Stop any activity at once if you have chest pain, shortness of breath, irregular heartbeats, or dizziness. Get help right away if you have any of these symptoms. °¨ Move around frequently for short periods or take short walks as directed by your health care provider. Increase your activities gradually. You may need physical therapy or cardiac rehabilitation to help strengthen your muscles and build your endurance. °¨ Avoid lifting, pushing, or pulling anything heavier than 10 lb (4.5 kg) for at least 6 weeks after surgery. °· Do not drive until your health care provider approves.  °· Ask your health care provider when you may return to work. °· Ask your health care provider when you may resume sexual activity. °· Keep all follow-up visits as directed by your health care   provider. This is important. °SEEK MEDICAL CARE IF: °· You have swelling, redness, increasing pain, or drainage at the site of an incision. °· You have a fever. °· You have swelling in your ankles or legs. °· You have pain in your legs.   °· You gain 2 or more pounds (0.9 kg) a day. °· You are nauseous or vomit. °· You have diarrhea.  °SEEK IMMEDIATE MEDICAL CARE IF: °· You have  chest pain that goes to your jaw or arms. °· You have shortness of breath.   °· You have a fast or irregular heartbeat.   °· You notice a "clicking" in your breastbone (sternum) when you move.   °· You have numbness or weakness in your arms or legs. °· You feel dizzy or light-headed.   °MAKE SURE YOU: °· Understand these instructions. °· Will watch your condition. °· Will get help right away if you are not doing well or get worse. °  °This information is not intended to replace advice given to you by your health care provider. Make sure you discuss any questions you have with your health care provider. °  °Document Released: 11/19/2004 Document Revised: 05/23/2014 Document Reviewed: 10/09/2012 °Elsevier Interactive Patient Education ©2016 [employer] ° °Endoscopic Saphenous Vein Harvesting, Care After °Refer to this sheet in the next few weeks. These instructions provide you with information on caring for yourself after your procedure. Your health care provider may also give you more specific instructions. Your treatment has been planned according to current medical practices, but problems sometimes occur. Call your health care provider if you have any problems or questions after your procedure. °HOME CARE INSTRUCTIONS °Medicine °· Take whatever pain medicine your surgeon prescribes. Follow the directions carefully. Do not take over-the-counter pain medicine unless your surgeon says it is okay. Some pain medicine can cause bleeding problems for several weeks after surgery. °· Follow your surgeon's instructions about driving. You will probably not be permitted to drive after heart surgery. °· Take any medicines your surgeon prescribes. Any medicines you took before your heart surgery should be checked with your health care provider before you start taking them again. °Wound care °· If your surgeon has prescribed an elastic bandage or stocking, ask how long you should wear it. °· Check the area around your surgical  cuts (incisions) whenever your bandages (dressings) are changed. Look for any redness or swelling. °· You will need to return to have the stitches (sutures) or staples taken out. Ask your surgeon when to do that. °· Ask your surgeon when you can shower or bathe. °Activity °· Try to keep your legs raised when you are sitting. °· Do any exercises your health care providers have given you. These may include deep breathing exercises, coughing, walking, or other exercises. °SEEK MEDICAL CARE IF: °· You have any questions about your medicines. °· You have more leg pain, especially if your pain medicine stops working. °· New or growing bruises develop on your leg. °· Your leg swells, feels tight, or becomes red. °· You have numbness in your leg. °SEEK IMMEDIATE MEDICAL CARE IF: °· Your pain gets much worse. °· Blood or fluid leaks from any of the incisions. °· Your incisions become warm, swollen, or red. °· You have chest pain. °· You have trouble breathing. °· You have a fever. °· You have more pain near your leg incision. °MAKE SURE YOU: °· Understand these instructions. °· Will watch your condition. °· Will get help right away if you are not doing well or   get worse. °  °This information is not intended to replace advice given to you by your health care provider. Make sure you discuss any questions you have with your health care provider. °  °Document Released: 01/12/2011 Document Revised: 05/23/2014 Document Reviewed: 01/12/2011 °Elsevier Interactive Patient Education ©2016 [employer] Inc. ° ° °

## 2015-09-18 NOTE — Progress Notes (Signed)
Order received to discharge.  Telemetry removed and CCMD notified.  IV removed with catheter intact.  Discharge education given to Pt with wife at bedside.  All questions answered.  Pt denies chest pain or sob at discharge.  Pt stable to discharge.

## 2015-09-18 NOTE — Progress Notes (Signed)
[address]       [place], [id number]             [phone]      4 Days Post-Op Procedure(s) (LRB): CORONARY ARTERY BYPASS GRAFTING times 5 using left internal mammary artery, left radial artery, and right greater saphenous vein harvested by endovein (N/A) TRANSESOPHAGEAL ECHOCARDIOGRAM (TEE) (N/A) LEFT RADIAL ARTERY HARVEST (Left) Subjective: Feels pretty well , senses PVC's at times  Objective: Vital signs in last 24 hours: Temp:  [98 F (36.7 C)-99 F (37.2 C)] 98.4 F (36.9 C) (05/05 0453) Pulse Rate:  [67-74] 71 (05/05 0453) Cardiac Rhythm:  [-] Normal sinus rhythm (05/04 1925) Resp:  [18] 18 (05/05 0453) BP: (102-111)/(48-55) 111/54 mmHg (05/05 0453) SpO2:  [94 %-96 %] 94 % (05/05 0453) Weight:  [275 lb 3.2 oz (124.83 kg)] 275 lb 3.2 oz (124.83 kg) (05/05 0453)  Hemodynamic parameters for last 24 hours:    Intake/Output from previous day: 05/04 0701 - 05/05 0700 In: 600 [P.O.:600] Out: -  Intake/Output this shift:    General appearance: alert, cooperative and no distress Heart: regular rate and rhythm Lungs: tubular in bases Abdomen: benign Extremities: + edema, L arm is N/V intact Wound: incis healing well  Lab Results:  Recent Labs  09/16/15 0430 09/17/15 0354  WBC 11.4* 10.8*  HGB 10.8* 10.6*  HCT 32.6* 33.0*  PLT 89* 117*   BMET:  Recent Labs  09/16/15 0430 09/17/15 0354  NA 135 136  K 4.1 4.4  CL 102 101  CO2 25 25  GLUCOSE 110* 108*  BUN 19 24*  CREATININE 1.09 1.07  CALCIUM 8.0* 8.3*    PT/INR: No results for input(s): LABPROT, INR in the last 72 hours. ABG    Component Value Date/Time   PHART 7.332* 09/14/2015 2023   HCO3 23.9 09/14/2015 2023   TCO2 25 09/15/2015 1720   ACIDBASEDEF 2.0 09/14/2015 2023   O2SAT 96.0 09/14/2015 2023   CBG (last 3)   Recent Labs  09/16/15 0425 09/16/15 0753 09/16/15 1645  GLUCAP 112* 110* 114*    Meds Scheduled Meds: . acetaminophen  1,000 mg Oral Q6H   Or    . acetaminophen (TYLENOL) oral liquid 160 mg/5 mL  1,000 mg Per Tube Q6H  . amiodarone  400 mg Oral BID  . aspirin EC  325 mg Oral Daily   Or  . aspirin  324 mg Per Tube Daily  . atorvastatin  80 mg Oral q1800  . bisacodyl  10 mg Oral Daily   Or  . bisacodyl  10 mg Rectal Daily  . docusate sodium  200 mg Oral Daily  . furosemide  40 mg Oral Daily  . isosorbide mononitrate  15 mg Oral Daily  . lisinopril  10 mg Oral Daily  . metoprolol tartrate  12.5 mg Oral BID   Or  . metoprolol tartrate  12.5 mg Per Tube BID  . pantoprazole  40 mg Oral Daily  . potassium chloride  20 mEq Oral Daily  . sodium chloride flush  3 mL Intravenous Q12H  . sodium chloride flush  3 mL Intravenous Q12H   Continuous Infusions: . sodium chloride Stopped (09/15/15 1500)  . sodium chloride    . sodium chloride Stopped (09/15/15 1500)   PRN Meds:.sodium chloride, sodium chloride, magnesium hydroxide, metoprolol, ondansetron (ZOFRAN) IV, oxyCODONE, sodium chloride flush, sodium chloride flush, traMADol, zolpidem  Xrays Dg Chest 2 View  09/17/2015  CLINICAL DATA:  Status post CABG 4 days ago EXAM: CHEST  2 VIEW COMPARISON:  Portable chest x-ray of Sep 16, 2015 at 5:46 a.m. FINDINGS: The lungs are better inflated today. There is minimal subsegmental atelectasis at the left lung base with a small left pleural effusion. A trace of pleural fluid on the right is suspected as well. There is no pneumothorax. The cardiac silhouette remains enlarged but its margins are more distinct. The pulmonary vascularity is less engorged. The sternal wires are intact. IMPRESSION: Improved aeration and decreased interstitial edema bilaterally. No pneumothorax. Persistent small left pleural effusion with basilar atelectasis. Trace right pleural effusion. Electronically Signed   By: David  [place] M.D.   On: 09/17/2015 07:48    Assessment/Plan: S/P Procedure(s) (LRB): CORONARY ARTERY BYPASS GRAFTING times 5 using left internal mammary  artery, left radial artery, and right greater saphenous vein harvested by endovein (N/A) TRANSESOPHAGEAL ECHOCARDIOGRAM (TEE) (N/A) LEFT RADIAL ARTERY HARVEST (Left)  1 doing well 2 increase beta blocker dose 3D/c epw's today 4 cont diuresis for volume overload/effus 5 poss home in am  LOS: 8 days    GOLD,WAYNE E 09/18/2015

## 2015-09-18 NOTE — Progress Notes (Signed)
Order received to DC EPW.  EPW removed.  Pt tol well.  Pt educated on bedrest x 1 hour.  Will monitor BP/P q 15 minutes.

## 2015-09-18 NOTE — Progress Notes (Signed)
CARDIAC REHAB PHASE I   PRE:  Rate/Rhythm: 73 SR    BP: sitting 124/66    SaO2: 96 RA  MODE:  Ambulation: 900 ft   POST:  Rate/Rhythm: 96 SR    BP: sitting 168/94     SaO2: 95 RA  Pt moving very well. Able to get out of bed and walk independently. No c/o. Ed completed with pt and wife. Very receptive. [facility]. Set up d/c video. [id number]   Mekaylah Klich Ethelda ChickKristan Morrisa Aldaba CES, ACSM 09/18/2015 10:59 AM

## 2015-09-25 ENCOUNTER — Ambulatory Visit (INDEPENDENT_AMBULATORY_CARE_PROVIDER_SITE_OTHER): Payer: Self-pay | Admitting: *Deleted

## 2015-09-25 DIAGNOSIS — Z4802 Encounter for removal of sutures: Secondary | ICD-10-CM

## 2015-09-25 DIAGNOSIS — Z951 Presence of aortocoronary bypass graft: Secondary | ICD-10-CM

## 2015-09-25 DIAGNOSIS — I2510 Atherosclerotic heart disease of native coronary artery without angina pectoris: Secondary | ICD-10-CM

## 2015-09-25 NOTE — Progress Notes (Signed)
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  These areas as well as the sternal incision and right leg evh site are all well healed.  Sutures and staples were easily removed. I did apply benzoin and steri strips to the radial incision. Appetite and bowels are good. Minimal Tramadol needed. He will return as scheduled with a cxr.

## 2015-10-13 ENCOUNTER — Other Ambulatory Visit: Payer: Self-pay | Admitting: Cardiothoracic Surgery

## 2015-10-13 DIAGNOSIS — Z951 Presence of aortocoronary bypass graft: Secondary | ICD-10-CM

## 2015-10-14 ENCOUNTER — Encounter: Payer: Self-pay | Admitting: Cardiothoracic Surgery

## 2015-10-14 ENCOUNTER — Ambulatory Visit (INDEPENDENT_AMBULATORY_CARE_PROVIDER_SITE_OTHER): Payer: Self-pay | Admitting: Cardiothoracic Surgery

## 2015-10-14 ENCOUNTER — Ambulatory Visit
Admission: RE | Admit: 2015-10-14 | Discharge: 2015-10-14 | Disposition: A | Discharge status: Home / Self Care | Payer: BLUE CROSS/BLUE SHIELD | Source: Ambulatory Visit | Attending: Cardiothoracic Surgery | Admitting: Cardiothoracic Surgery

## 2015-10-14 VITALS — BP 136/72 | HR 55 | Resp 16 | Ht 70.0 in | Wt 256.0 lb

## 2015-10-14 DIAGNOSIS — Z951 Presence of aortocoronary bypass graft: Secondary | ICD-10-CM

## 2015-10-14 DIAGNOSIS — I2510 Atherosclerotic heart disease of native coronary artery without angina pectoris: Secondary | ICD-10-CM

## 2015-10-14 NOTE — Progress Notes (Signed)
PCP is Eldridge Abrahams, MD Referring Provider is Daryel November, MD  Chief Complaint  Patient presents with  . Routine Post Op    f/u from surgery with CXR s/p Coronary artery bypass grafting x5, 09/14/2015     HPI: One month scheduled postop visit after urgent CABG x5 using left radial artery Patient doing well without symptoms of recurrent angina, walking 30 minutes daily Surgical incisions healing well Not requiring any pain medication Appetite and sleeping patterns are improving   Past Medical History  Diagnosis Date  . Coronary artery disease   . Anginal pain (HCC)   . Hypertension   . Seasonal allergies   . Arthritis     oa    Past Surgical History  Procedure Laterality Date  . Knee arthroscopy Bilateral   . Wisdom tooth extraction    . Cardiac catheterization  09/09/2015    at [facility]  . Coronary artery bypass graft N/A 09/14/2015    Procedure: CORONARY ARTERY BYPASS GRAFTING times 5 using left internal mammary artery, left radial artery, and right greater saphenous vein harvested by endovein;  Surgeon: Kerin Perna, MD;  Location: [facility] OR;  Service: Open Heart Surgery;  Laterality: N/A;  . Tee without cardioversion N/A 09/14/2015    Procedure: TRANSESOPHAGEAL ECHOCARDIOGRAM (TEE);  Surgeon: Kerin Perna, MD;  Location: [facility] OR;  Service: Open Heart Surgery;  Laterality: N/A;  . Radial artery harvest Left 09/14/2015    Procedure: LEFT RADIAL ARTERY HARVEST;  Surgeon: Kerin Perna, MD;  Location: [facility] OR;  Service: Open Heart Surgery;  Laterality: Left;    No family history on file.  Social History Social History  Substance Use Topics  . Smoking status: Never Smoker   . Smokeless tobacco: Never Used  . Alcohol Use: No    Current Outpatient Prescriptions  Medication Sig Dispense Refill  . amiodarone (PACERONE) 200 MG tablet Take 1 tablet (200 mg total) by mouth 2 (two) times daily. 60 tablet 1  . Ascorbic Acid (VITAMIN C) 1000 MG tablet Take 1,000 mg by  mouth daily.    Marland Kitchen atorvastatin (LIPITOR) 80 MG tablet Take 1 tablet (80 mg total) by mouth daily at 6 PM. 30 tablet 1  . lisinopril-hydrochlorothiazide (PRINZIDE,ZESTORETIC) 10-12.5 MG tablet Take 1 tablet by mouth daily.  5  . metoprolol tartrate (LOPRESSOR) 25 MG tablet Take 1 tablet (25 mg total) by mouth 2 (two) times daily. 60 tablet 1  . Multiple Vitamin (MULTIVITAMIN WITH MINERALS) TABS tablet Take 1 tablet by mouth daily.    . Omega-3 Fatty Acids (FISH OIL) 1000 MG CAPS Take 1,000 mg by mouth daily.    . vitamin E 400 UNIT capsule Take 400 Units by mouth daily.    Marland Kitchen aspirin EC 325 MG EC tablet Take 1 tablet (325 mg total) by mouth daily.     No current facility-administered medications for this visit.    No Known Allergies  Review of Systems  No complaints of sternal instability  BP 136/72 mmHg  Pulse 55  Resp 16  Ht  (1.778 m)  Wt 256 lb (116.121 kg)  BMI 36.73 kg/m2  SpO2 98% Physical Exam Alert and comfortable Lungs clear  Heart rate regular Leg incision is well-healed Sternal incision well-healed and stable No pedal edema  Diagnostic Tests: Chest x-ray clear with small left pleural effusion at the costophrenic angle  Impression: Doing well one month after urgent CABG x5 Patient will increase his activity levels which we discussed in detail  We will stop the amiodarone and Imdur  He'll return to the care of Dr. Daryel NovemberGary Miller who has [facility] him [facility]  Plan:return as needed  Mikey BussingPeter Van Trigt III, MD [facility] [phone]

## 2015-11-05 ENCOUNTER — Encounter: Payer: Self-pay | Admitting: Internal Medicine

## 2016-12-10 IMAGING — CR DG CHEST 2V
2 series · 2 of 2 positions shown · non-contrast
Comparison: Chest x-ray of 09/17/2015

CLINICAL DATA: CABG on 09/14/2015

EXAM:
CHEST  2 VIEW

[w chest pa]
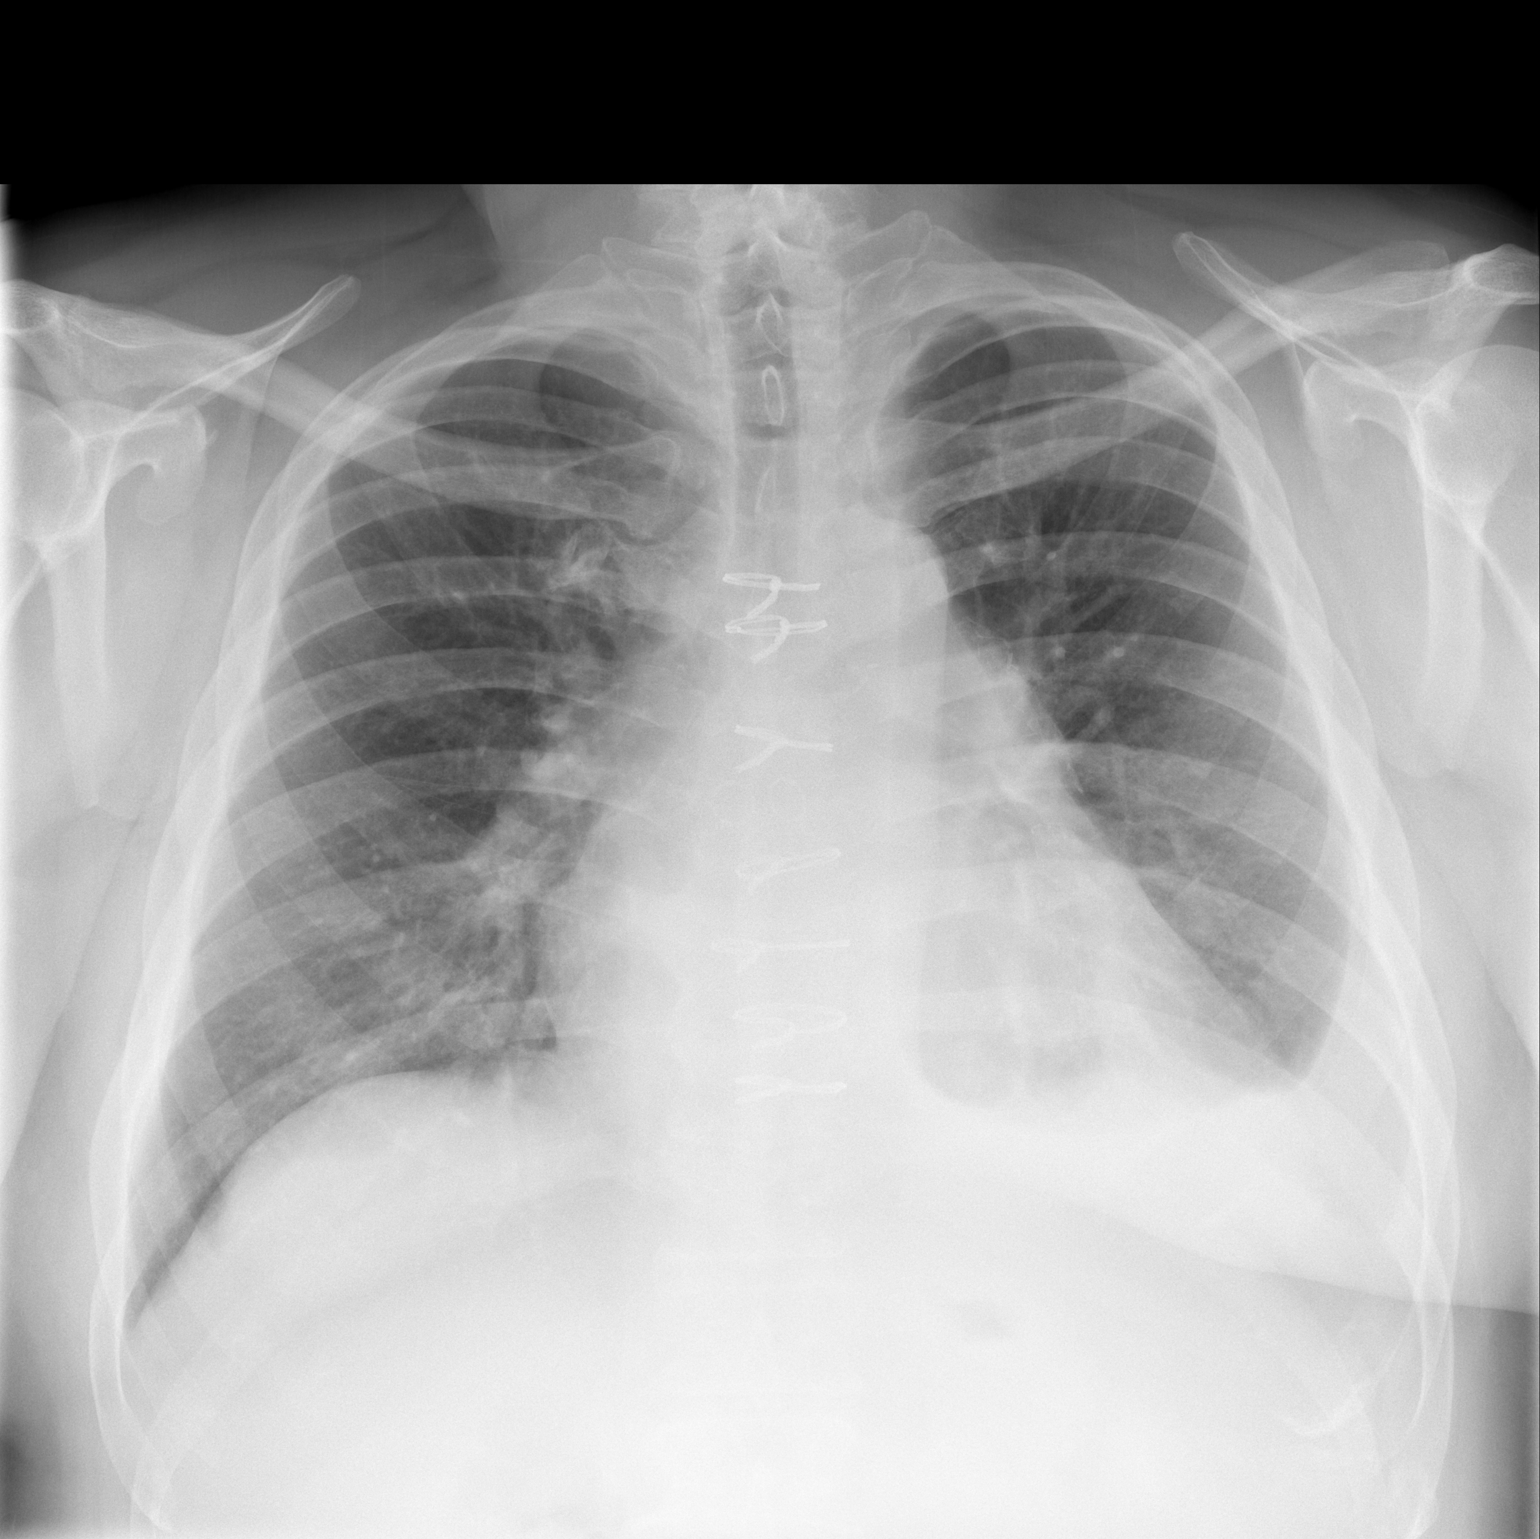

[w chest lat]
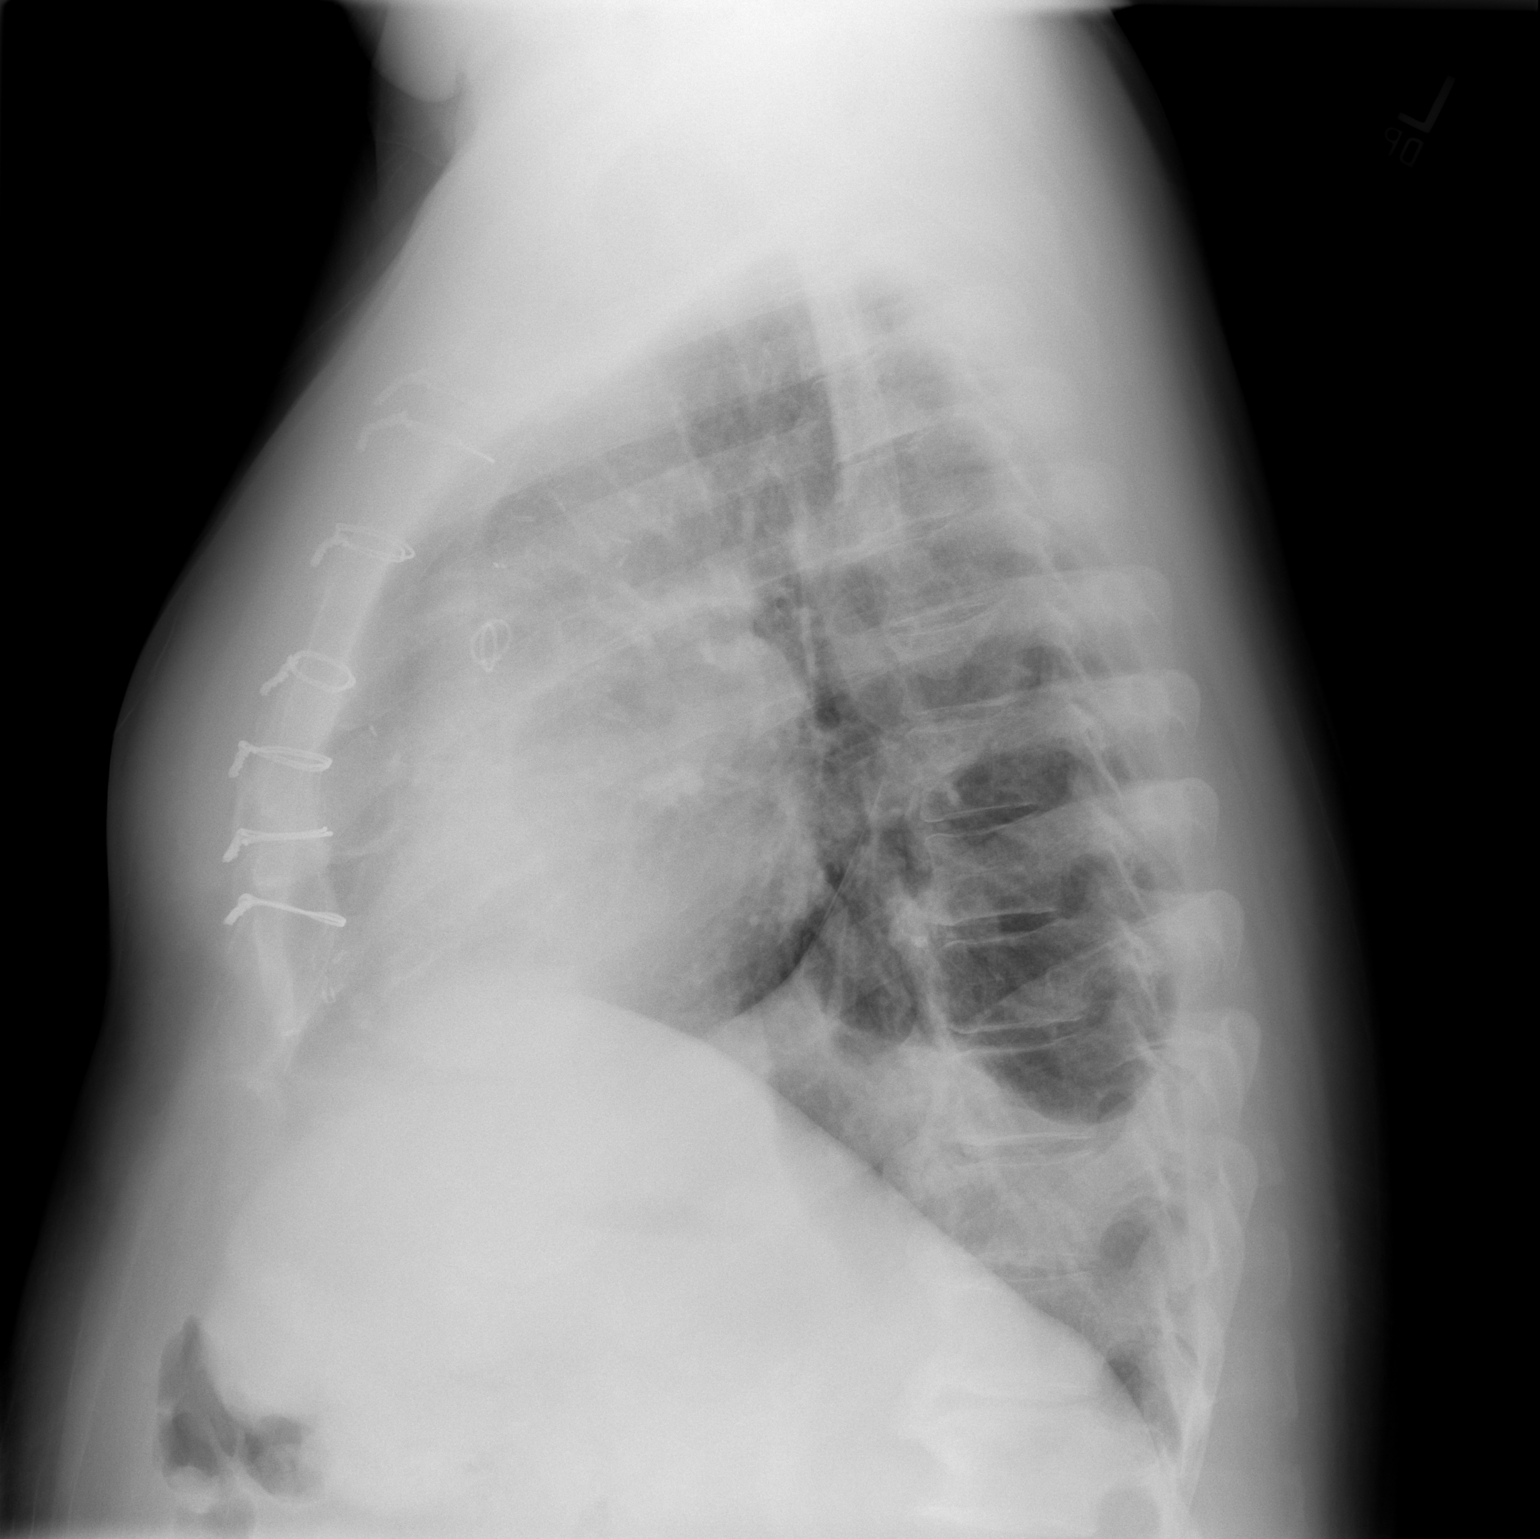

[2 of 2 positions shown; findings below may reference images not displayed]

FINDINGS: There has been a slight increase in the small left pleural effusion
present with left basilar linear atelectasis noted. The right lung
is clear. Cardiomegaly is stable. Median sternotomy sutures are
noted.
IMPRESSION: Slight increase in small left pleural effusion with left basilar
atelectasis.

## 2017-03-15 DIAGNOSIS — E785 Hyperlipidemia, unspecified: Secondary | ICD-10-CM | POA: Insufficient documentation

## 2017-04-20 ENCOUNTER — Encounter: Payer: Self-pay | Admitting: Nurse Practitioner

## 2017-10-25 ENCOUNTER — Encounter: Payer: Self-pay | Admitting: Nurse Practitioner

## 2018-03-21 DIAGNOSIS — I259 Chronic ischemic heart disease, unspecified: Secondary | ICD-10-CM | POA: Insufficient documentation

## 2018-03-21 DIAGNOSIS — I10 Essential (primary) hypertension: Secondary | ICD-10-CM | POA: Insufficient documentation

## 2018-03-22 ENCOUNTER — Encounter: Payer: Self-pay | Admitting: Gastroenterology

## 2018-03-22 DIAGNOSIS — I2510 Atherosclerotic heart disease of native coronary artery without angina pectoris: Secondary | ICD-10-CM | POA: Insufficient documentation

## 2018-04-20 ENCOUNTER — Encounter: Payer: BLUE CROSS/BLUE SHIELD | Admitting: Gastroenterology

## 2018-04-27 ENCOUNTER — Encounter: Payer: BLUE CROSS/BLUE SHIELD | Admitting: Gastroenterology

## 2020-04-23 DIAGNOSIS — D126 Benign neoplasm of colon, unspecified: Secondary | ICD-10-CM | POA: Insufficient documentation

## 2020-12-30 DIAGNOSIS — I517 Cardiomegaly: Secondary | ICD-10-CM | POA: Insufficient documentation

## 2022-04-25 DIAGNOSIS — E559 Vitamin D deficiency, unspecified: Secondary | ICD-10-CM | POA: Insufficient documentation

## 2022-04-25 DIAGNOSIS — F1320 Sedative, hypnotic or anxiolytic dependence, uncomplicated: Secondary | ICD-10-CM | POA: Insufficient documentation

## 2022-06-08 DIAGNOSIS — D72820 Lymphocytosis (symptomatic): Secondary | ICD-10-CM | POA: Insufficient documentation

## 2022-06-08 DIAGNOSIS — D696 Thrombocytopenia, unspecified: Secondary | ICD-10-CM | POA: Insufficient documentation

## 2022-06-08 DIAGNOSIS — R7989 Other specified abnormal findings of blood chemistry: Secondary | ICD-10-CM | POA: Insufficient documentation

## 2023-06-09 ENCOUNTER — Ambulatory Visit: Payer: BLUE CROSS/BLUE SHIELD | Admitting: Gastroenterology

## 2023-06-13 DIAGNOSIS — K760 Fatty (change of) liver, not elsewhere classified: Secondary | ICD-10-CM | POA: Insufficient documentation

## 2023-06-14 ENCOUNTER — Other Ambulatory Visit (INDEPENDENT_AMBULATORY_CARE_PROVIDER_SITE_OTHER): Payer: Medicare HMO

## 2023-06-14 ENCOUNTER — Encounter: Payer: Self-pay | Admitting: Gastroenterology

## 2023-06-14 ENCOUNTER — Ambulatory Visit: Payer: Medicare HMO | Admitting: Gastroenterology

## 2023-06-14 VITALS — BP 134/84 | HR 50 | Ht 71.0 in | Wt 226.0 lb

## 2023-06-14 DIAGNOSIS — R7401 Elevation of levels of liver transaminase levels: Secondary | ICD-10-CM | POA: Diagnosis not present

## 2023-06-14 DIAGNOSIS — R748 Abnormal levels of other serum enzymes: Secondary | ICD-10-CM | POA: Diagnosis not present

## 2023-06-14 DIAGNOSIS — K760 Fatty (change of) liver, not elsewhere classified: Secondary | ICD-10-CM

## 2023-06-14 LAB — HEPATIC FUNCTION PANEL
ALT: 34 U/L (ref 0–53)
AST: 67 U/L — ABNORMAL HIGH (ref 0–37)
Albumin: 2.9 g/dL — ABNORMAL LOW (ref 3.5–5.2)
Alkaline Phosphatase: 180 U/L — ABNORMAL HIGH (ref 39–117)
Bilirubin, Direct: 0.6 mg/dL — ABNORMAL HIGH (ref 0.0–0.3)
Total Bilirubin: 1.3 mg/dL — ABNORMAL HIGH (ref 0.2–1.2)
Total Protein: 5.5 g/dL — ABNORMAL LOW (ref 6.0–8.3)

## 2023-06-14 NOTE — Patient Instructions (Signed)
_______________________________________________________  If your blood pressure at your visit was 140/90 or greater, please contact your primary care physician to follow up on this.  If you are age 63 or younger, your body mass index should be between 19-25. Your Body mass index is 31.52 kg/m. If this is out of the aformentioned range listed, please consider follow up with your Primary Care Provider.  ________________________________________________________  The [place] [facility] providers would like to encourage you to use [facility] to communicate with providers for non-urgent requests or questions.  Due to long hold times on the telephone, sending your provider a message by [facility] may be a faster and more efficient way to get a response.  Please allow 48 business hours for a response.  Please remember that this is for non-urgent requests.  _______________________________________________________  Your provider has requested that you go to the basement level for lab work before leaving today. Press "B" on the elevator. The lab is located at the first door on the left as you exit the elevator.  You have been scheduled for a CT scan of the abdomen and pelvis at [facility], 1st floor Radiology. You are scheduled on 06-22-23 at 2:30pm. You should arrive 15 minutes prior to your appointment time for registration.   Please follow the written instructions below on the day of your exam:   1) Do not eat anything after 10:30am (4 hours prior to your test)   You may take any medications as prescribed with a small amount of water, if necessary. If you take any of the following medications: METFORMIN, GLUCOPHAGE, GLUCOVANCE, AVANDAMET, RIOMET, FORTAMET, ACTOPLUS MET, JANUMET, GLUMETZA or METAGLIP, you MAY be asked to HOLD this medication 48 hours AFTER the exam.   Plan on being at [facility] for 45 minutes or longer, depending on the type of exam you are having performed.   If you have any questions  regarding your exam or if you need to reschedule, you may call [facility] at [phone] between the hours of 8:00 am and 5:00 pm, Monday-Friday.   Due to recent changes in healthcare laws, you may see the results of your imaging and laboratory studies on MyChart before your provider has had a chance to review them.  We understand that in some cases there may be results that are confusing or concerning to you. Not all laboratory results come back in the same time frame and the provider may be waiting for multiple results in order to interpret others.  Please give [place] 48 hours in order for your provider to thoroughly review all the results before contacting the office for clarification of your results.   It was a pleasure to see you today!  Vito Cirigliano, D.O.

## 2023-06-14 NOTE — Progress Notes (Signed)
Chief Complaint: Elevated liver enzymes   Referring Provider:     Pomposini, Rande Brunt, MD   HPI:     Billy Waters is a 63 y.o. male with a history of pulmonary hypertension, HTN, HLD, LVH, anxiety, thrombocytopenia, CAD s/p 5V CABG 2017 (on ASA 81 mg), chronic back pain (chronic pain medication, stimulator in place), arthritis, spinal stenosis, referred to the Gastroenterology Clinic for evaluation of elevated liver enzymes.  Reviewed 31 pages of notes from referring physician, notable for the following: - 06/14/2022: Abdominal ultrasound: Slightly heterogenous liver, otherwise normal  - 09/28/2021: Protein 5.7, albumin 3.8, AST/ALT 36/18, ALP 111, T. bili 0.9 - 04/26/2022: Protein 5.8, albumin 3.6, AST/ALT 45/22, ALP 150, T. bili 1.0 - 05/26/2022: Protein 5.3, albumin 3.3, AST/ALT 64/31, ALP 160, T. bili 1.0 - 06/08/2022: Protein 6.5, albumin 3.6, AST/ALT 57/29, ALP 185, T. bili 1.5.  PLT 90, otherwise normal CBC - 08/05/2022: Protein 5.3, albumin 3.3, AST/ALT 58/30, ALP 189, T. bili 1.0, direct 0.5, indirect 0.5 - 12/15/2022:       -PLT 70, MCV 100.5, otherwise normal CBC      -Ferritin 124, iron 135, TIBC 204, iron sat 66%      -Protein 5.5, albumin 3.4, AST/ALT 65/27, ALP 192, bilirubin 1.4, direct 0.5, indirect 0.9.  GGT 26      -HBsAb + at 96k, HBcAb-,      -ASMA mildly positive at 23 (ULN 20)      -Negative/normal ceruloplasmin, ANA, A1 AT, AMA, AFP      -FibroTest with fibrosis score 0.73 (stage F3-F4), necroinflammatory active score 0.23 (A0-A1) - 03/17/2023: AST/ALT 77/30, ALP 238, Tbili 0.9  Reviewed several sets of liver enzymes prior to 2022 which were all normal dating back to 09/2015.  Reports previous colonoscopy in 2021 and notable for 1 small benign polyp, with recommendation to repeat in 10 years.   Has been following with Hematology at [facility] for his thrombocytopenia and lymphocytosis dating back to 2022.  From the limited available records, appears  that monitoring is current course of action.  No history of liver biopsy, bone marrow biopsy.  Does have a history of arthritis and potentially planning for bilateral knee replacement surgery in the near future.  Does not drink any alcohol.  No history of jaundice, icteric sclera.  No known personal history of underlying liver or pancreaticobiliary disease.   Past Medical History:  Diagnosis Date   Anginal pain (HCC)    Arthritis    oa   Coronary artery disease    Hypertension    Seasonal allergies    Vitamin D deficiency      Past Surgical History:  Procedure Laterality Date   CARDIAC CATHETERIZATION  09/09/2015   at [facility]   CORONARY ARTERY BYPASS GRAFT N/A 09/14/2015   Procedure: CORONARY ARTERY BYPASS GRAFTING times 5 using left internal mammary artery, left radial artery, and right greater saphenous vein harvested by endovein;  Surgeon: Kerin Perna, MD;  Location: [facility] OR;  Service: Open Heart Surgery;  Laterality: N/A;   KNEE ARTHROSCOPY Bilateral    RADIAL ARTERY HARVEST Left 09/14/2015   Procedure: LEFT RADIAL ARTERY HARVEST;  Surgeon: Kerin Perna, MD;  Location: [facility] OR;  Service: Open Heart Surgery;  Laterality: Left;   SPINAL CORD STIMULATOR IMPLANT     TEE WITHOUT CARDIOVERSION N/A 09/14/2015   Procedure: TRANSESOPHAGEAL ECHOCARDIOGRAM (TEE);  Surgeon: Kerin Perna, MD;  Location:  [facility] OR;  Service: Open Heart Surgery;  Laterality: N/A;   WISDOM TOOTH EXTRACTION     Family History  Problem Relation Age of Onset   Breast cancer Mother    Heart disease Father    Hypertension Father    Cancer Father    Social History   Tobacco Use   Smoking status: Never   Smokeless tobacco: Never  Vaping Use   Vaping status: Never Used  Substance Use Topics   Alcohol use: Not Currently   Drug use: No   Current Outpatient Medications  Medication Sig Dispense Refill   Ascorbic Acid (VITAMIN C) 1000 MG tablet Take 1,000 mg by mouth daily.     aspirin EC 81 MG  tablet Take 81 mg by mouth daily. Swallow whole.     buprenorphine (BUTRANS) 7.5 MCG/HR Place onto the skin.     Calcium-Magnesium-Vitamin D (CALCIUM MAGNESIUM PO) Take by mouth.     carvedilol (COREG) 6.25 MG tablet Take 6.25 mg by mouth 2 (two) times daily with a meal.     Cholecalciferol (D 1000) 25 MCG (1000 UT) capsule Take 1,000 Units by mouth daily.     cyclobenzaprine (FLEXERIL) 10 MG tablet Take 10 mg by mouth 2 (two) times daily.     escitalopram (LEXAPRO) 10 MG tablet Take 10 mg by mouth daily.     Eszopiclone 3 MG TABS Take 3 mg by mouth at bedtime.     lisinopril-hydrochlorothiazide (ZESTORETIC) 20-25 MG tablet Take 1 tablet by mouth daily.  5   Multiple Vitamin (MULTIVITAMIN WITH MINERALS) TABS tablet Take 1 tablet by mouth daily.     nitroGLYCERIN (NITROSTAT) 0.4 MG SL tablet Place 0.4 mg under the tongue every 5 (five) minutes as needed for chest pain.     Omega-3 Fatty Acids (FISH OIL) 1000 MG CAPS Take 1,000 mg by mouth in the morning and at bedtime.     rosuvastatin (CRESTOR) 20 MG tablet Take 20 mg by mouth daily.     tadalafil (CIALIS) 20 MG tablet Take 20 mg by mouth daily as needed for erectile dysfunction.     vitamin E 400 UNIT capsule Take 400 Units by mouth daily.     No current facility-administered medications for this visit.   No Known Allergies   Review of Systems: All systems reviewed and negative except where noted in HPI.     Physical Exam:    Wt Readings from Last 3 Encounters:  06/14/23 226 lb (102.5 kg)  10/14/15 256 lb (116.1 kg)  09/18/15 275 lb 3.2 oz (124.8 kg)    BP 134/84   Pulse (!) 50   Ht 5\' 11"  (1.803 m)   Wt 226 lb (102.5 kg)   BMI 31.52 kg/m  Constitutional:  Pleasant, in no acute distress. Psychiatric: Normal mood and affect. Behavior is normal. Neurological: Alert and oriented to person place and time. Skin: Skin is warm and dry. No rashes noted.   ASSESSMENT AND PLAN;   1) Elevated AST 2) Elevated alkaline  phosphatase 3) Hepatic steatosis 63 year old male with mildly elevated AST and alkaline phosphatase starting around 04/2022.  Prior to that, liver enzymes have been normal dating back several years.  Aside from a few instances of mildly elevated bilirubin which peaked at 1.5, bilirubin has otherwise remained normal over the years.  Ultrasound with mildly increased hepatic echotexture, but no duct dilation or other worrisome features.  Extended serologic workup in 12/2022 with very mildly elevated ASMA at 23, but otherwise unremarkable.  -  Recheck liver enzymes today - Check antiliver kidney microsomal antibody - Unable to complete MRI/MRCP due to spinal stim.  Will instead do CT three-phase liver - Check HAV Ab status - Discussed the role/utility of liver biopsy - ALP isoenzyme fractionization   4) Hypertension 5) Hyperlipidemia 6) CAD with prior CABG 7) Pulmonary hypertension - Medical management of underlying comorbidities per PCM  RTC in 6 months or sooner prn    Shellia Cleverly, DO, FACG  06/14/2023, 8:50 AM   Pomposini, Rande Brunt, MD

## 2023-06-16 LAB — HEPATITIS A ANTIBODY, TOTAL: Hepatitis A AB,Total: REACTIVE — AB

## 2023-06-16 LAB — ANTI-MICROSOMAL ANTIBODY LIVER / KIDNEY: LKM1 Ab: <=20 U (ref <=20.0)

## 2023-06-21 ENCOUNTER — Telehealth: Payer: Self-pay | Admitting: Gastroenterology

## 2023-06-21 NOTE — Telephone Encounter (Signed)
 Patient is calling back to inquire about his test results. Patient is requesting a call back. Please advise.

## 2023-06-21 NOTE — Telephone Encounter (Signed)
 Peterson Brandt has taken care of this and spoke with patient already

## 2023-06-22 ENCOUNTER — Ambulatory Visit (HOSPITAL_COMMUNITY)
Admission: RE | Admit: 2023-06-22 | Discharge: 2023-06-22 | Disposition: A | Discharge status: Home / Self Care | Payer: Medicare HMO | Source: Ambulatory Visit | Attending: Gastroenterology | Admitting: Gastroenterology

## 2023-06-22 DIAGNOSIS — R748 Abnormal levels of other serum enzymes: Secondary | ICD-10-CM | POA: Diagnosis present

## 2023-06-22 DIAGNOSIS — R7401 Elevation of levels of liver transaminase levels: Secondary | ICD-10-CM | POA: Diagnosis present

## 2023-06-22 MED ORDER — IOHEXOL 350 MG/ML SOLN
100.0000 mL | Freq: Once | INTRAVENOUS | Status: AC | PRN
  Administered 2023-06-22: 100 mL via INTRAVENOUS

## 2023-06-23 LAB — ALKALINE PHOSPHATASE, ISOENZYMES
Alkaline Phosphatase: 227 [IU]/L — ABNORMAL HIGH (ref 44–121)
BONE FRACTION: 35 % (ref 12–68)
INTESTINAL FRAC.: 19 % — ABNORMAL HIGH (ref 0–18)
LIVER FRACTION: 46 % (ref 13–88)

## 2023-07-12 ENCOUNTER — Other Ambulatory Visit: Payer: Self-pay

## 2023-07-12 ENCOUNTER — Telehealth: Payer: Self-pay | Admitting: Gastroenterology

## 2023-07-12 DIAGNOSIS — R7401 Elevation of levels of liver transaminase levels: Secondary | ICD-10-CM

## 2023-07-12 DIAGNOSIS — R9389 Abnormal findings on diagnostic imaging of other specified body structures: Secondary | ICD-10-CM

## 2023-07-12 DIAGNOSIS — K760 Fatty (change of) liver, not elsewhere classified: Secondary | ICD-10-CM

## 2023-07-12 DIAGNOSIS — R748 Abnormal levels of other serum enzymes: Secondary | ICD-10-CM

## 2023-07-12 NOTE — Telephone Encounter (Signed)
 Patient returned call about results, requesting a call back discuss. Please advise.

## 2023-07-13 ENCOUNTER — Encounter: Payer: Self-pay | Admitting: Gastroenterology

## 2023-07-14 ENCOUNTER — Other Ambulatory Visit: Payer: Medicare HMO

## 2023-07-14 ENCOUNTER — Encounter: Payer: Self-pay | Admitting: Gastroenterology

## 2023-07-14 ENCOUNTER — Ambulatory Visit: Payer: Medicare HMO | Admitting: Gastroenterology

## 2023-07-14 VITALS — BP 130/56 | HR 73 | Temp 98.7°F | Resp 9 | Ht 71.0 in | Wt 226.0 lb

## 2023-07-14 DIAGNOSIS — K2100 Gastro-esophageal reflux disease with esophagitis, without bleeding: Secondary | ICD-10-CM | POA: Diagnosis not present

## 2023-07-14 DIAGNOSIS — K2950 Unspecified chronic gastritis without bleeding: Secondary | ICD-10-CM | POA: Diagnosis not present

## 2023-07-14 DIAGNOSIS — R7401 Elevation of levels of liver transaminase levels: Secondary | ICD-10-CM

## 2023-07-14 DIAGNOSIS — K3189 Other diseases of stomach and duodenum: Secondary | ICD-10-CM | POA: Diagnosis not present

## 2023-07-14 DIAGNOSIS — R748 Abnormal levels of other serum enzymes: Secondary | ICD-10-CM

## 2023-07-14 DIAGNOSIS — R9389 Abnormal findings on diagnostic imaging of other specified body structures: Secondary | ICD-10-CM | POA: Diagnosis not present

## 2023-07-14 DIAGNOSIS — K7460 Unspecified cirrhosis of liver: Secondary | ICD-10-CM

## 2023-07-14 DIAGNOSIS — K760 Fatty (change of) liver, not elsewhere classified: Secondary | ICD-10-CM

## 2023-07-14 DIAGNOSIS — K2210 Ulcer of esophagus without bleeding: Secondary | ICD-10-CM | POA: Diagnosis present

## 2023-07-14 DIAGNOSIS — K2970 Gastritis, unspecified, without bleeding: Secondary | ICD-10-CM

## 2023-07-14 LAB — CBC
HCT: 44 % (ref 39.0–52.0)
Hemoglobin: 14.6 g/dL (ref 13.0–17.0)
MCHC: 33.3 g/dL (ref 30.0–36.0)
MCV: 105.1 fL — ABNORMAL HIGH (ref 78.0–100.0)
Platelets: 61 10*3/uL — ABNORMAL LOW (ref 150.0–400.0)
RBC: 4.19 Mil/uL — ABNORMAL LOW (ref 4.22–5.81)
RDW: 15.3 % (ref 11.5–15.5)
WBC: 5 10*3/uL (ref 4.0–10.5)

## 2023-07-14 LAB — BASIC METABOLIC PANEL
BUN: 17 mg/dL (ref 6–23)
CO2: 31 meq/L (ref 19–32)
Calcium: 8.7 mg/dL (ref 8.4–10.5)
Chloride: 103 meq/L (ref 96–112)
Creatinine, Ser: 0.77 mg/dL (ref 0.40–1.50)
GFR: 95.93 mL/min (ref >60.00)
Glucose, Bld: 103 mg/dL — ABNORMAL HIGH (ref 70–99)
Potassium: 4.2 meq/L (ref 3.5–5.1)
Sodium: 140 meq/L (ref 135–145)

## 2023-07-14 LAB — PROTIME-INR
INR: 1.7 {ratio} — ABNORMAL HIGH (ref 0.8–1.0)
Prothrombin Time: 17.3 s — ABNORMAL HIGH (ref 9.6–13.1)

## 2023-07-14 MED ORDER — PANTOPRAZOLE SODIUM 40 MG PO TBEC: 40.0000 mg | DELAYED_RELEASE_TABLET | Freq: Two times a day (BID) | ORAL | 4 refills | Status: AC

## 2023-07-14 MED ORDER — SODIUM CHLORIDE 0.9 % IV SOLN: 500.0000 mL | INTRAVENOUS | Status: DC

## 2023-07-14 NOTE — Progress Notes (Signed)
 GASTROENTEROLOGY PROCEDURE H&P NOTE   Primary Care Physician: Eldridge Abrahams, MD    Reason for Procedure:  Esophageal varices screening  Plan:    EGD  Patient is appropriate for endoscopic procedure(s) in the ambulatory ([facility]) setting.  The nature of the procedure, as well as the risks, benefits, and alternatives were carefully and thoroughly reviewed with the patient. Ample time for discussion and questions allowed. The patient understood, was satisfied, and agreed to proceed.     HPI: Billy Waters is a 62 y.o. male who presents for EGD for esophageal varices screening.  Patient was most recently seen in the Gastroenterology Clinic on 06/14/2023 by me for evaluation of elevated liver enzymes and hepatic steatosis.    Please refer to that note for full details regarding GI history and clinical presentation.   Since then, completed CT three-phase liver which shows cirrhotic appearing liver and features of portal hypertension (portal venous collaterals, mesenteric edema but no ascites).   Past Medical History:  Diagnosis Date   Anginal pain (HCC)    Arthritis    oa   Coronary artery disease    Hypertension    Seasonal allergies    Vitamin D deficiency     Past Surgical History:  Procedure Laterality Date   CARDIAC CATHETERIZATION  09/09/2015   at [facility]   CORONARY ARTERY BYPASS GRAFT N/A 09/14/2015   Procedure: CORONARY ARTERY BYPASS GRAFTING times 5 using left internal mammary artery, left radial artery, and right greater saphenous vein harvested by endovein;  Surgeon: Kerin Perna, MD;  Location: [facility] OR;  Service: Open Heart Surgery;  Laterality: N/A;   KNEE ARTHROSCOPY Bilateral    RADIAL ARTERY HARVEST Left 09/14/2015   Procedure: LEFT RADIAL ARTERY HARVEST;  Surgeon: Kerin Perna, MD;  Location: [facility] OR;  Service: Open Heart Surgery;  Laterality: Left;   SPINAL CORD STIMULATOR IMPLANT     TEE WITHOUT CARDIOVERSION N/A 09/14/2015   Procedure:  TRANSESOPHAGEAL ECHOCARDIOGRAM (TEE);  Surgeon: Kerin Perna, MD;  Location: [facility] OR;  Service: Open Heart Surgery;  Laterality: N/A;   WISDOM TOOTH EXTRACTION      Prior to Admission medications   Medication Sig Start Date End Date Taking? Authorizing Provider  Ascorbic Acid (VITAMIN C) 1000 MG tablet Take 1,000 mg by mouth daily.    [provider]  aspirin EC 81 MG tablet Take 81 mg by mouth daily. Swallow whole.    [provider]  buprenorphine (BUTRANS) 7.5 MCG/HR Place onto the skin.    [provider]  Calcium-Magnesium-Vitamin D (CALCIUM MAGNESIUM PO) Take by mouth.    [provider]  carvedilol (COREG) 6.25 MG tablet Take 6.25 mg by mouth 2 (two) times daily with a meal. 04/18/22   [provider]  Cholecalciferol (D 1000) 25 MCG (1000 UT) capsule Take 1,000 Units by mouth daily.    [provider]  cyclobenzaprine (FLEXERIL) 10 MG tablet Take 10 mg by mouth 2 (two) times daily. 06/01/22   [provider]  escitalopram (LEXAPRO) 10 MG tablet Take 10 mg by mouth daily. 12/11/21   [provider]  Eszopiclone 3 MG TABS Take 3 mg by mouth at bedtime. 05/17/22   [provider]  lisinopril-hydrochlorothiazide (ZESTORETIC) 20-25 MG tablet Take 1 tablet by mouth daily. 09/03/15   [provider]  Multiple Vitamin (MULTIVITAMIN WITH MINERALS) TABS tablet Take 1 tablet by mouth daily.    [provider]  NIACIN ER PO Take 1,000  mg by mouth in the morning and at bedtime.    [provider]  nitroGLYCERIN (NITROSTAT) 0.4 MG SL tablet Place 0.4 mg under the tongue every 5 (five) minutes as needed for chest pain.    [provider]  Omega-3 Fatty Acids (FISH OIL) 1000 MG CAPS Take 1,000 mg by mouth in the morning and at bedtime.    [provider]  rosuvastatin (CRESTOR) 20 MG tablet Take 20 mg by mouth daily.    [provider]  tadalafil (CIALIS) 20 MG tablet Take 20  mg by mouth daily as needed for erectile dysfunction.    [provider]  vitamin E 400 UNIT capsule Take 400 Units by mouth daily.    [provider]    Current Outpatient Medications  Medication Sig Dispense Refill   Ascorbic Acid (VITAMIN C) 1000 MG tablet Take 1,000 mg by mouth daily.     aspirin EC 81 MG tablet Take 81 mg by mouth daily. Swallow whole.     buprenorphine (BUTRANS) 7.5 MCG/HR Place onto the skin.     Calcium-Magnesium-Vitamin D (CALCIUM MAGNESIUM PO) Take by mouth.     carvedilol (COREG) 6.25 MG tablet Take 6.25 mg by mouth 2 (two) times daily with a meal.     Cholecalciferol (D 1000) 25 MCG (1000 UT) capsule Take 1,000 Units by mouth daily.     cyclobenzaprine (FLEXERIL) 10 MG tablet Take 10 mg by mouth 2 (two) times daily.     escitalopram (LEXAPRO) 10 MG tablet Take 10 mg by mouth daily.     Eszopiclone 3 MG TABS Take 3 mg by mouth at bedtime.     lisinopril-hydrochlorothiazide (ZESTORETIC) 20-25 MG tablet Take 1 tablet by mouth daily.  5   Multiple Vitamin (MULTIVITAMIN WITH MINERALS) TABS tablet Take 1 tablet by mouth daily.     NIACIN ER PO Take 1,000 mg by mouth in the morning and at bedtime.     nitroGLYCERIN (NITROSTAT) 0.4 MG SL tablet Place 0.4 mg under the tongue every 5 (five) minutes as needed for chest pain.     Omega-3 Fatty Acids (FISH OIL) 1000 MG CAPS Take 1,000 mg by mouth in the morning and at bedtime.     rosuvastatin (CRESTOR) 20 MG tablet Take 20 mg by mouth daily.     tadalafil (CIALIS) 20 MG tablet Take 20 mg by mouth daily as needed for erectile dysfunction.     vitamin E 400 UNIT capsule Take 400 Units by mouth daily.     No current facility-administered medications for this visit.    Allergies as of 07/14/2023   (No Known Allergies)    Family History  Problem Relation Age of Onset   Breast cancer Mother    Heart disease Father    Hypertension Father    Cancer Father     Social History   Socioeconomic History    Marital status: Married    Spouse name: Not on file   Number of children: Not on file   Years of education: Not on file   Highest education level: Not on file  Occupational History   Not on file  Tobacco Use   Smoking status: Never   Smokeless tobacco: Never  Vaping Use   Vaping status: Never Used  Substance and Sexual Activity   Alcohol use: Not Currently   Drug use: No   Sexual activity: Not on file  Other Topics Concern   Not on file  Social History Narrative  Not on file   Social Drivers of Health   Financial Resource Strain: Not on file  Food Insecurity: Not on file  Transportation Needs: Not on file  Physical Activity: Not on file  Stress: Not on file  Social Connections: Not on file  Intimate Partner Violence: Not on file    Physical Exam: Vital signs in last 24 hours: @There  were no vitals taken for this visit. GEN: NAD EYE: Sclerae anicteric ENT: MMM CV: Non-tachycardic Pulm: CTA b/l GI: Soft, NT/ND NEURO:  Alert & Oriented x 3   Doristine Locks, DO [facility]   07/14/2023 1:15 PM

## 2023-07-14 NOTE — Progress Notes (Signed)
 Called to room to assist during endoscopic procedure.  Patient ID and intended procedure confirmed with present staff. Received instructions for my participation in the procedure from the performing physician.

## 2023-07-14 NOTE — Op Note (Signed)
 [facility] Patient Name: Billy Waters Procedure Date: 07/14/2023 1:52 PM MRN: [id number] Endoscopist: Doristine Locks , MD, [id number] Age: 63 Referring MD:  Date of Birth: 12/04/1960 Gender: Male Account #: [id number] Procedure:                Upper GI endoscopy Indications:              Cirrhosis rule out esophageal varices Medicines:                Monitored Anesthesia Care Procedure:                Pre-Anesthesia Assessment:                           - Prior to the procedure, a History and Physical                            was performed, and patient medications and                            allergies were reviewed. The patient's tolerance of                            previous anesthesia was also reviewed. The risks                            and benefits of the procedure and the sedation                            options and risks were discussed with the patient.                            All questions were answered, and informed consent                            was obtained. Prior Anticoagulants: The patient has                            taken no anticoagulant or antiplatelet agents. ASA                            Grade Assessment: II - A patient with mild systemic                            disease. After reviewing the risks and benefits,                            the patient was deemed in satisfactory condition to                            undergo the procedure.                           After obtaining informed consent, the endoscope was  passed under direct vision. Throughout the                            procedure, the patient's blood pressure, pulse, and                            oxygen saturations were monitored continuously. The                            GIF [id number] #[id number] was introduced through the                            mouth, and advanced to the second part of duodenum.                            The upper GI  endoscopy was accomplished without                            difficulty. The patient tolerated the procedure                            well. Scope In: Scope Out: Findings:                 Two superficial esophageal ulcers with no bleeding                            and no stigmata of recent bleeding were found 30 cm                            from the incisors. The largest lesion was 3 mm in                            largest dimension. Biopsies were taken with a cold                            forceps for histology. Estimated blood loss was                            minimal.                           LA Grade A (one or more mucosal breaks less than 5                            mm, not extending between tops of 2 mucosal folds)                            esophagitis with no bleeding was found at the                            gastroesophageal junction.  The gastroesophageal flap valve was visualized                            endoscopically and classified as Hill Grade III                            (minimal fold, loose to endoscope, hiatal hernia                            likely).                           Diffuse mild inflammation characterized by                            congestion (edema) and erythema was found in the                            entire examined stomach. Biopsies were taken with a                            cold forceps for histology and Helicobacter pylori                            testing. Estimated blood loss was minimal.                           A single 5 mm submucosal nodule with no bleeding                            was found in the gastric antrum. The nodule was                            firm and mobile. The overlying mucosa was normal                            appearing. Several bite-on-bite biopsies were taken                            with a cold forceps for histology. Estimated blood                            loss was  minimal.                           The examined duodenum was normal. Complications:            No immediate complications. Estimated Blood Loss:     Estimated blood loss was minimal. Impression:               - Esophageal ulcers with no bleeding and no                            stigmata of recent bleeding. Biopsied.                           -  LA Grade A reflux esophagitis with no bleeding.                           - Gastroesophageal flap valve classified as Hill                            Grade III (minimal fold, loose to endoscope, hiatal                            hernia likely).                           - Gastritis. Biopsied.                           - A single submucosal nodule found in the stomach.                            Biopsied.                           - Normal examined duodenum.                           - No esophageal varices noted on this study. Recommendation:           - Patient has a contact number available for                            emergencies. The signs and symptoms of potential                            delayed complications were discussed with the                            patient. Return to normal activities tomorrow.                            Written discharge instructions were provided to the                            patient.                           - Resume previous diet.                           - Continue present medications.                           - Await pathology results.                           - Use Protonix (pantoprazole) 40 mg PO BID for 6                            weeks to promote mucosal healing,  then reduce to 40                            mg daily.                           - Depending on pathology results, can consider                            repeat EGD in 1 year for follow-up of the gastric                            nodule vs referral for EUS. Will follow-up on                            pathology results and  determine timing and                            procedure.                           - Follow-up in the GI clinic in 3 months or sooner                            as needed. Doristine Locks, MD 07/14/2023 2:36:02 PM

## 2023-07-14 NOTE — Patient Instructions (Addendum)
 - Resume previous diet.                           - Continue present medications.                           - Await pathology results.                           - Use Protonix (pantoprazole) 40 mg PO BID for 6                            weeks to promote mucosal healing, then reduce to 40                            mg daily.                           - Depending on pathology results, can consider                            repeat EGD in 1 year for follow-up of the gastric                            nodule vs referral for EUS. Will follow-up on                            pathology results and determine timing and                            procedure.                           - Follow-up in the GI clinic in 3 months or sooner                            as needed.  YOU HAD AN ENDOSCOPIC PROCEDURE TODAY AT THE [facility]:   Refer to the procedure report that was given to you for any specific questions about what was found during the examination.  If the procedure report does not answer your questions, please call your gastroenterologist to clarify.  If you requested that your care partner not be given the details of your procedure findings, then the procedure report has been included in a sealed envelope for you to review at your convenience later.  YOU SHOULD EXPECT: Some feelings of bloating in the abdomen. Passage of more gas than usual.  Walking can help get rid of the air that was put into your GI tract during the procedure and reduce the bloating. If you had a lower endoscopy (such as a colonoscopy or flexible sigmoidoscopy) you may notice spotting of blood in your stool or on the toilet paper. If you underwent a bowel prep for your procedure, you may not have a normal bowel movement for a few days.  Please Note:  You might notice some irritation and congestion in your nose or some drainage.  This is from the oxygen used during your procedure.  There is no  need for concern and it should clear up in a day or so.  SYMPTOMS TO REPORT IMMEDIATELY:  Following upper endoscopy (EGD)  Vomiting of blood or coffee ground material  New chest pain or pain under the shoulder blades  Painful or persistently difficult swallowing  New shortness of breath  Fever of 100F or higher  Black, tarry-looking stools  For urgent or emergent issues, a gastroenterologist can be reached at any hour by calling [phone]. Do not use MyChart messaging for urgent concerns.    DIET:  We do recommend a small meal at first, but then you may proceed to your regular diet.  Drink plenty of fluids but you should avoid alcoholic beverages for 24 hours.  ACTIVITY:  You should plan to take it easy for the rest of today and you should NOT DRIVE or use heavy machinery until tomorrow (because of the sedation medicines used during the test).    FOLLOW UP: Our staff will call the number listed on your records the next business day following your procedure.  We will call around 7:15- 8:00 am to check on you and address any questions or concerns that you may have regarding the information given to you following your procedure. If we do not reach you, we will leave a message.     If any biopsies were taken you will be contacted by phone or by letter within the next 1-3 weeks.  Please call us at [phone] if you have not heard about the biopsies in 3 weeks.    SIGNATURES/CONFIDENTIALITY: You and/or your care partner have signed paperwork which will be entered into your electronic medical record.  These signatures attest to the fact that that the information above on your After Visit Summary has been reviewed and is understood.  Full responsibility of the confidentiality of this discharge information lies with you and/or your care-partner.

## 2023-07-14 NOTE — Progress Notes (Signed)
 Vss nad trans to pacu

## 2023-07-17 ENCOUNTER — Telehealth: Payer: Self-pay

## 2023-07-17 ENCOUNTER — Encounter: Payer: Self-pay | Admitting: Gastroenterology

## 2023-07-17 NOTE — Telephone Encounter (Signed)
 No answer, left message to call if having any issues or concerns, B.Vale Mousseau RN

## 2023-07-19 LAB — SURGICAL PATHOLOGY

## 2023-07-20 ENCOUNTER — Encounter: Payer: Self-pay | Admitting: Gastroenterology

## 2023-10-04 ENCOUNTER — Ambulatory Visit: Payer: Medicare HMO | Admitting: Gastroenterology

## 2023-12-18 ENCOUNTER — Telehealth: Payer: Self-pay

## 2023-12-18 DIAGNOSIS — R748 Abnormal levels of other serum enzymes: Secondary | ICD-10-CM

## 2023-12-18 DIAGNOSIS — K760 Fatty (change of) liver, not elsewhere classified: Secondary | ICD-10-CM

## 2023-12-18 NOTE — Telephone Encounter (Signed)
 Left message for patient to return call to our office.  Patient is scheduled for RUQ abd ultrasound at [facility] on 12-22-23 at 8:30am.  Patient should arrive at 8:15am and NPO after midnight the night before.  Will continue efforts.

## 2023-12-18 NOTE — Telephone Encounter (Signed)
-----   Message from [facility] R sent at 11/14/2023  3:37 PM EDT ----- Regarding: FW: [id number]  ----- Message ----- From: Benjamine Nat DEL, CMA Sent: 11/14/2023  12:00 AM EDT To: Nat DEL Benjamine, CMA Subject: [id number]                                     RUQ ultrasound and AFP per Dr JAYSON

## 2023-12-19 NOTE — Telephone Encounter (Signed)
 Left message for patient to return call to further discuss.  Also, sent patient a message in MyChart.

## 2023-12-21 ENCOUNTER — Other Ambulatory Visit: Payer: Self-pay

## 2023-12-21 DIAGNOSIS — R748 Abnormal levels of other serum enzymes: Secondary | ICD-10-CM

## 2023-12-21 DIAGNOSIS — K760 Fatty (change of) liver, not elsewhere classified: Secondary | ICD-10-CM

## 2023-12-22 ENCOUNTER — Ambulatory Visit (HOSPITAL_COMMUNITY)

## 2024-02-22 ENCOUNTER — Telehealth: Payer: Self-pay | Admitting: Gastroenterology

## 2024-02-22 NOTE — Telephone Encounter (Signed)
 Inbound call from [place] with [facility] stating they need new orders for patient sent for patient. He is coming in tomorrow and they need the orders today fore 2:00pm. Please advise.

## 2024-02-22 NOTE — Telephone Encounter (Signed)
 Order for RUQ abdominal ultrasound faxed to [facility] as requested. Confirmation received.

## 2024-03-19 ENCOUNTER — Telehealth: Payer: Self-pay | Admitting: Gastroenterology

## 2024-03-19 NOTE — Telephone Encounter (Signed)
 Please contact Dr. Alray to let him know that I received the results from his abdominal ultrasound from [facility] which was notable for hepatic steatosis but otherwise no duct dilation and normal portal vein flow.  Gallbladder is normal.    Separately, echogenic right kidney consistent with medical renal disease.  Looks like he is scheduled for follow-up with Dr. Zamor in the [facility] in 4 weeks.  Please ensure that a copy of these results are scanned into the record for review, and to keep that appointment as scheduled.

## 2024-03-20 NOTE — Telephone Encounter (Signed)
 Left message for patient to return call to further discuss results.

## 2024-03-22 NOTE — Telephone Encounter (Signed)
 Left message for patient to return call to further discuss results.

## 2024-03-26 NOTE — Telephone Encounter (Signed)
 Left message on wife's voicemail to return call to further discuss recommendations.  Will continue efforts.

## 2024-03-28 NOTE — Telephone Encounter (Signed)
 Left message for patient to return call to office to further discuss.   After multiple attempts to reach the patient with no success, a letter was mailed to the patient with Dr Rennis recommendations.
